# Patient Record
Sex: Male | Born: 1970
Health system: Southern US, Community
[De-identification: ages and names within clinical notes are randomized; demographics above are authoritative.]

## PROBLEM LIST (undated history)

## (undated) DIAGNOSIS — E669 Obesity, unspecified: Secondary | ICD-10-CM

## (undated) DIAGNOSIS — G8929 Other chronic pain: Secondary | ICD-10-CM

## (undated) DIAGNOSIS — G709 Myoneural disorder, unspecified: Secondary | ICD-10-CM

## (undated) DIAGNOSIS — E119 Type 2 diabetes mellitus without complications: Secondary | ICD-10-CM

## (undated) DIAGNOSIS — K219 Gastro-esophageal reflux disease without esophagitis: Secondary | ICD-10-CM

## (undated) DIAGNOSIS — K449 Diaphragmatic hernia without obstruction or gangrene: Secondary | ICD-10-CM

## (undated) DIAGNOSIS — M549 Dorsalgia, unspecified: Secondary | ICD-10-CM

## (undated) DIAGNOSIS — M1990 Unspecified osteoarthritis, unspecified site: Secondary | ICD-10-CM

## (undated) HISTORY — DX: Dorsalgia, unspecified: M54.9

## (undated) HISTORY — DX: Diaphragmatic hernia without obstruction or gangrene: K44.9

## (undated) HISTORY — PX: UPPER GASTROINTESTINAL ENDOSCOPY: SHX188

## (undated) HISTORY — DX: Obesity, unspecified: E66.9

## (undated) HISTORY — DX: Gastro-esophageal reflux disease without esophagitis: K21.9

## (undated) HISTORY — DX: Unspecified osteoarthritis, unspecified site: M19.90

## (undated) HISTORY — DX: Other chronic pain: G89.29

## (undated) HISTORY — DX: Type 2 diabetes mellitus without complications: E11.9

## (undated) HISTORY — DX: Myoneural disorder, unspecified: G70.9

---
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
## 2002-07-27 ENCOUNTER — Ambulatory Visit (HOSPITAL_COMMUNITY): Admission: RE | Admit: 2002-07-27 | Discharge: 2002-07-27 | Payer: Self-pay | Admitting: Gastroenterology

## 2010-08-13 HISTORY — PX: SHOULDER ARTHROSCOPY WITH DEBRIDEMENT AND BICEP TENDON REPAIR: SHX5690

## 2010-11-13 ENCOUNTER — Ambulatory Visit: Payer: BC Managed Care – PPO | Attending: Orthopedic Surgery | Admitting: Physical Therapy

## 2010-11-13 DIAGNOSIS — M25519 Pain in unspecified shoulder: Secondary | ICD-10-CM | POA: Insufficient documentation

## 2010-11-13 DIAGNOSIS — M25619 Stiffness of unspecified shoulder, not elsewhere classified: Secondary | ICD-10-CM | POA: Insufficient documentation

## 2010-11-13 DIAGNOSIS — IMO0001 Reserved for inherently not codable concepts without codable children: Secondary | ICD-10-CM | POA: Insufficient documentation

## 2010-11-15 ENCOUNTER — Ambulatory Visit: Payer: BC Managed Care – PPO | Admitting: Physical Therapy

## 2010-11-20 ENCOUNTER — Ambulatory Visit: Payer: BC Managed Care – PPO | Admitting: Physical Therapy

## 2010-11-22 ENCOUNTER — Ambulatory Visit: Payer: BC Managed Care – PPO | Admitting: Physical Therapy

## 2010-11-27 ENCOUNTER — Ambulatory Visit: Payer: BC Managed Care – PPO | Admitting: Physical Therapy

## 2010-11-29 ENCOUNTER — Ambulatory Visit: Payer: BC Managed Care – PPO | Admitting: Physical Therapy

## 2010-12-05 ENCOUNTER — Ambulatory Visit: Payer: BC Managed Care – PPO | Admitting: Physical Therapy

## 2010-12-19 ENCOUNTER — Ambulatory Visit: Payer: BC Managed Care – PPO | Attending: Orthopedic Surgery | Admitting: Physical Therapy

## 2010-12-19 DIAGNOSIS — M25619 Stiffness of unspecified shoulder, not elsewhere classified: Secondary | ICD-10-CM | POA: Insufficient documentation

## 2010-12-19 DIAGNOSIS — M25519 Pain in unspecified shoulder: Secondary | ICD-10-CM | POA: Insufficient documentation

## 2010-12-19 DIAGNOSIS — IMO0001 Reserved for inherently not codable concepts without codable children: Secondary | ICD-10-CM | POA: Insufficient documentation

## 2011-01-02 ENCOUNTER — Ambulatory Visit: Payer: BC Managed Care – PPO | Admitting: Physical Therapy

## 2012-06-29 ENCOUNTER — Emergency Department
Admission: EM | Admit: 2012-06-29 | Discharge: 2012-06-30 | Disposition: A | Discharge status: Home / Self Care | Payer: Self-pay | Attending: Emergency Medicine | Admitting: Emergency Medicine

## 2012-06-29 ENCOUNTER — Encounter (HOSPITAL_COMMUNITY): Payer: Self-pay

## 2012-06-29 DIAGNOSIS — X58XXXA Exposure to other specified factors, initial encounter: Secondary | ICD-10-CM | POA: Insufficient documentation

## 2012-06-29 DIAGNOSIS — R58 Hemorrhage, not elsewhere classified: Secondary | ICD-10-CM

## 2012-06-29 DIAGNOSIS — S51809A Unspecified open wound of unspecified forearm, initial encounter: Secondary | ICD-10-CM | POA: Insufficient documentation

## 2012-06-29 DIAGNOSIS — Y929 Unspecified place or not applicable: Secondary | ICD-10-CM | POA: Insufficient documentation

## 2012-06-29 MED ORDER — LIDOCAINE-EPINEPHRINE 1 %-1:100000 IJ SOLN
10.00 mL | Freq: Once | INTRAMUSCULAR | Status: AC
Start: 2012-06-29 — End: 2012-06-29
  Administered 2012-06-29: 10 mL via INTRADERMAL
  Filled 2012-06-29: qty 20

## 2012-06-29 MED ORDER — BACITRACIN 500 UNIT/GM EX OINT
TOPICAL_OINTMENT | Freq: Once | CUTANEOUS | Status: AC
Start: 2012-06-29 — End: 2012-06-29
  Filled 2012-06-29: qty 0.9

## 2012-06-29 NOTE — ED Notes (Signed)
Xray here to see pt but the pt is not in the room.  Xray will reschedule the pt for another attempt ''shortly''.

## 2012-06-29 NOTE — ED Notes (Addendum)
Nursing note:  Pt arrives with c/o a possible abcess to the left elbow.  Pt reports that he felt some ''itching'' to the region earlier in the day at the site.  Pt then reports that he killed a spider later in the day that he may have come in contact with.  The area is swollen, red, warm, and has some noticeable bruising to the distal edge of the wound.  Pt has a h/o MRSA to the buttocks.  Pt denies any iv drug use.  Pt states that the wound has become obvious approx 6 hours ago and the he had his ''girl try to pop it''. States that he had blood and pus drain from the wound at that time.    ER resident in with the pt at this time.

## 2012-06-29 NOTE — ED Attending Note (Signed)
ED ATTENDING NOTE:    [facility]g Urgent Care Brief Note:     Pt seen and examined with dr Leda Gauze and I agree with assessment & plan    HPI: agree w/res hpi    PMH/FH/SH/Meds/ALL per triage and dr Leda Gauze notes and HPI    Vitals noted WNL, afebrile, normal O2 saturation on room air  Gen: wdwn, NAD, fluid speech  HEENT: ncat, perrla, anicteric conjunctiva, mmm  Neck: no meningismus, no LAD, no midline ttp, JVP flat  Back: no CVAT, no midline ttp   Lungs: ctab, no w/r/r  CV: rrr, no m/r/g   Abdo: soft, ntnd, nabs, no masses   Ext: LUE with ecchymotic appearing 5inch x 3inch diam patch on undersurface of upper arm just proximal to olecranon, no bony ttp, from of elbow and shoulder, no warmth/induration/erythema or central clearing. No dc from site. NVI. no edema, DP 2+ b/l  Neuro: a&o x4, cn 2-12 grossly intact. Fluid speech. Symmetric face. Nonfocal    Diagnostic studies:     Radiology:No acute osseous abnormality.      Impression&Plan: arm wound most c/w ecchymosis though i did not see it before i and d. Per residents and pt it has "evolved" so we did take a photo and will provide close f/u. There is essentially no induration or warmth, no purulence, i do not think it needs oral abx. Will update tetanus and do xr to r/u elbow injury. Anticipate dc    Strict RTED precautions given for any new or worsening sx and pt expressed understanding    Radiology read neg for osseous abnl, will dc with wound follow up . Most c/w bruise though pt denies injury he was moving furniture today. No secondary sings of infx. Wound check 1-2 days.

## 2012-06-29 NOTE — ED Notes (Signed)
Pt back in room at this time, pt reports that he was in the bathroom at the time that xray was in the room to see him.  Pt instructed to remain in the room until xray completed.

## 2012-06-29 NOTE — ED Notes (Signed)
Bedside u/s complete by ER resident, awaiting orders.

## 2012-06-30 NOTE — ED Notes (Signed)
Assisting primary rn  - Pt is aa&ox3, denies any complaints at this time. Dc instructions provided with verbalized understanding to follow-up with his PMD and to return sooner with any problems/concerns. Pt ambulated out of the ED with steady/even gait. Enc to return in 24-48 hours for a wound check

## 2012-06-30 NOTE — ED Procedure Note (Signed)
Procedure Note  Incision and Drainage  Date/Time: 06/30/2012 1:12 AM  Performed by: Will Bonnet  Authorized by: Rosana Fret  Consent: Verbal consent obtained.  Risks and benefits: risks, benefits and alternatives were discussed  Consent given by: patient  Patient understanding: patient states understanding of the procedure being performed  Patient identity confirmed: verbally with patient  Type: abscess  Body area: upper extremity  Location details: left arm  Anesthesia: local infiltration  Local anesthetic: lidocaine 1% with epinephrine  Anesthetic total: 5 ml  Patient sedated: no  Incision type: single straight  Complexity: simple  Drainage: bloody and serous  Drainage amount: scant  Wound treatment: wound left open  Patient tolerance: Patient tolerated the procedure well with no immediate complications.

## 2012-06-30 NOTE — Discharge Instructions (Signed)
Contusion    You have been diagnosed with a contusion.    A contusion is a bruise. A contusion occurs when something strikes or hits the body. This breaks small blood vessels called capillaries. When the capillaries break, blood leaks out. This makes the skin look red, purple, blue, or black. The injured area may hurt for a few days. If you take a blood thinner (like Coumadin or warfarin) the bruising may be worse.    Apply ice to the bruise. Avoid using the injured body part.    Wound Recheck    You have been seen today for a recheck of your wound.    Your wound is not healing as well as we would expect.    Return for another wound check in 2 days.    YOU SHOULD SEEK MEDICAL ATTENTION IMMEDIATELY, EITHER HERE OR AT THE NEAREST EMERGENCY DEPARTMENT, IF ANY OF THE FOLLOWING OCCURS:   Your wound looks red, you see pus, or you have pain, swelling, or fever. These are signs of infection.   You notice red streaks spreading from the site of the wound.   Your wound seems worse in any way.        Apply ice to help with pain and swelling. Put some ice cubes in a re-sealable plastic bag (like Ziploc). Add some water. Seal the bag. Put a thin washcloth between the bag and the skin. Apply the ice bag for at least 20 minutes. Do this at least 4 times per day. It's okay to apply ice longer or more often. NEVER APPLY ICE DIRECTLY TO THE SKIN. Always keep a washcloth between the ice pack and your body.    YOU SHOULD SEEK MEDICAL ATTENTION IMMEDIATELY, EITHER HERE OR AT THE NEAREST EMERGENCY DEPARTMENT, IF ANY OF THE FOLLOWING OCCURS:   Your pain or swelling gets much worse.   You develop new numbness or tingling in or below the affected area.   Your foot or hand looks cold or pale. This could mean there is a problem with circulation (blood supply).

## 2012-06-30 NOTE — ED Provider Notes (Addendum)
Emergency Dept Note    Chief Complaint:   Chief Complaint   Patient presents with   . Insect Bite     pt states may have been bitten by spider while helping friend move, gf tried to express puss from lue       HPI:  Alisha Burgo is a 41 year old  male with hx of MRSA abcess on buttocks who presents with ecchymosis and swelling for six hours.  Patient was cleaning boxes out of the back of his car and said that he killed a brow recluse spider.  He is unsure if it bit him.  Patient said he squeezed wound and some blood and milky discharge came out.  He denies trauma/f/c/n/v/cp/sob    There are no discharge medications for this patient.      Allergies: Penicillins    Past Medical History:   MRSA abcess    Past Surgical History:   abscess drainage    Family History:   No hx of infection    Social History:   Tobacco: 1ppd  EtOH: no longer  Drug abuse (illicit, IV, Rx): hx of meth but not current user  Living situation: lives in car  Employment: unemployued    ROS:  As per HPI, unless noted below   Review of systems:   Gen (-) fever   Neuro (-) headache, (-) arm/leg weakness, (-) difficulty speaking/walking   ENT (-) sore throat   Eyes (-) blurry vision   Resp (-) cough   GI (-) abd pain, (-) vomiting   CV (-) chest pain   GU (-) dysuria   Musculoskeletal (-) neck pain, (-) back pain   Skin (-) rashes        Physical exam  Vital signs reviewed and noted -   4    06/29/12  1958   BP: 172/91   Pulse: 100   Temp: 98.3 F (36.8 C)   Resp: 16   SpO2: 96%        Gen: Patient is in NAD, A&O, behaving appropriately, non-toxic appearing  HEENT: NC/AT, PERRL. No icterus, ptosis. Normal oropharynx w/out exudates, erythema. Moist mucous membranes.  Neck: Supple, no JVD, no LAD.  Lungs: Normal breath sounds. No wheeze/rales/rhonchi   CV: RRR. Normal heart sounds. No murmurs appreciated  Abdomen: Normal bowel sounds. NTND. No masses, organomegaly.  Back: No CVA tenderness.  Ext: LUE with ecchymotic appearing 5inch x 3inch diam  patch on undersurface of upper arm just proximal to olecranon, no bony ttp, from of elbow and shoulder, no warmth/ slight erythema a small central clearing. No dc from site. NVI. no edema, DP 2+ b/l, 1 cm inscision made over  Neurologic: Mentation appropriate. Gait normal. CN II-XII grossly normal.    LABS  None    DIAGNOSTIC STUDIES  Bedside US showed 0.5cmx0.5cm pocket.  LEX Xray: no osseus abnormality      Assessment and Clinical Decision-making  Arm wound most c/w ecchymosis though there was concern for infection and US showed small fluid collection.  I and D was performed and only serosanguinous fluid came out.  The wound evolved over the course of the stay in the ED and appeared more ecchymotic at the end.  The patient was therefor felt to not need abx, given ecchymotic appearance and no systemic complaints.  Wound was dressed and abx ointment placed over wound.    Dispo: Wound check 1-2 days.      Patient discussed with attending, Dr. Rachell Cipro,  before final recommendations are made.               Will Bonnet, MD  Resident  06/30/12 2245    Will Bonnet, MD  Resident  06/30/12 [phone]

## 2012-07-01 NOTE — ED Follow-up Note (Signed)
Follow-up type: Callback       Routine ED Patient Call Back    Patient unable to be contacted, no message left

## 2012-07-02 NOTE — ED Follow-up Note (Signed)
Follow-up type: Callback       Routine ED Patient Call Back    Patient unable to be contacted, message left

## 2014-10-18 ENCOUNTER — Institutional Professional Consult (permissible substitution): Payer: Self-pay | Admitting: Neurology

## 2014-10-20 ENCOUNTER — Ambulatory Visit (INDEPENDENT_AMBULATORY_CARE_PROVIDER_SITE_OTHER): Payer: BLUE CROSS/BLUE SHIELD | Admitting: Neurology

## 2014-10-20 ENCOUNTER — Encounter: Payer: Self-pay | Admitting: Neurology

## 2014-10-20 VITALS — BP 125/80 | HR 85 | Temp 98.6°F | Resp 20 | Ht 70.0 in | Wt 207.0 lb

## 2014-10-20 DIAGNOSIS — F17200 Nicotine dependence, unspecified, uncomplicated: Secondary | ICD-10-CM

## 2014-10-20 DIAGNOSIS — K219 Gastro-esophageal reflux disease without esophagitis: Secondary | ICD-10-CM | POA: Diagnosis not present

## 2014-10-20 DIAGNOSIS — G2581 Restless legs syndrome: Secondary | ICD-10-CM

## 2014-10-20 DIAGNOSIS — R51 Headache: Secondary | ICD-10-CM | POA: Diagnosis not present

## 2014-10-20 DIAGNOSIS — Z720 Tobacco use: Secondary | ICD-10-CM | POA: Diagnosis not present

## 2014-10-20 DIAGNOSIS — G4733 Obstructive sleep apnea (adult) (pediatric): Secondary | ICD-10-CM

## 2014-10-20 DIAGNOSIS — R519 Headache, unspecified: Secondary | ICD-10-CM

## 2014-10-20 NOTE — Patient Instructions (Signed)

## 2014-10-20 NOTE — Progress Notes (Signed)
Subjective:    Patient ID: Austin Gonzalez is a 44 y.o. male.  HPI     Star Age, MD, PhD [facility] [address] [place], [place] [address]  Dear Dr. Osborne Casco,  I saw your patient, Austin Gonzalez, upon your kind request in my neurologic clinic today for initial consultation of his sleep disorder, in particular, concern for underlying obstructive sleep apnea. The patient is unaccompanied today. As you know, Austin Gonzalez is a very pleasant 44 year old right-handed gentleman with an underlying medical history of hyperlipidemia, reflux disease, type 2 diabetes, chronic back pain and smoking, who reports snoring, which can be loud per his girlfriend's report and waking up with a panic. He has had gasping sensations while asleep and was told he quits breathing in his sleep. His mother has a history of obstructive sleep apnea. He had a tonsillectomy at age 43.  He smokes 1 pack per day, he drinks alcohol occasionally, he denies taking any illicit drugs, and drinks 3 cups of coffee per day. He works as a Occupational hygienist.  He wakes up with a sense of panic and gasping for air. He still has nocturnal GERD and wakes up coughing. He has an occasional morning headaches.   He lives in an apartment, next door to his parents. His bedtime varies, usually after 11 PM or MN. He does not keep a very good sleep awake schedule. He has to get up usually before 8 AM. He does not wake up rested. He does not typically taken. He has never had a sleep study but would be willing to One done. He feels tired during the day. He has occasional restless leg symptoms and that his legs bother him at night and this depends on how much physical work he has done that day. He is not sure if he twitches or kicks in his sleep. His mother was diagnosed with sleep apnea but could not use the CPAP machine. His Epworth sleepiness score is 6 out of 24, his fatigue score is 33. He denies  any parasomnias.  His Past Medical History Is Significant For: Past Medical History  Diagnosis Date  . GERD (gastroesophageal reflux disease)   . Diabetes mellitus without complication   . Obesity   . Chronic back pain   . Hiatal hernia     His Past Surgical History Is Significant For: Past Surgical History  Procedure Laterality Date  . Tonsillectomy  1975  . Shoulder arthroscopy with debridement and bicep tendon repair  2012    His Family History Is Significant For: Family History  Problem Relation Age of Onset  . Arthritis Mother   . Hypertension Mother   . Sleep apnea Mother   . Heart disease Father   . Hypertension Father   . Urolithiasis Father   . Heart attack Father     His Social History Is Significant For: History   Social History  . Marital Status: Single    Spouse Name: N/A  . Number of Children: 0  . Years of Education: Associates   Occupational History  . Self-employed     Architect   Social History Main Topics  . Smoking status: Current Every Day Smoker -- 1.00 packs/day for 25 years    Types: Cigarettes  . Smokeless tobacco: Not on file  . Alcohol Use: 1.2 oz/week    2 Standard drinks or equivalent per week  . Drug Use: Not on file  . Sexual Activity: Not on  file   Other Topics Concern  . None   Social History Narrative   3 caffeine drinks a day     His Allergies Are:  No Known Allergies:   His Current Medications Are:  Outpatient Encounter Prescriptions as of 10/20/2014  Medication Sig  . aspirin 81 MG tablet Take 81 mg by mouth daily.  Marland Kitchen atorvastatin (LIPITOR) 20 MG tablet Take 20 mg by mouth daily.  Marland Kitchen Co-Enzyme Q-10 100 MG CAPS Take by mouth 1 day or 1 dose.  . linagliptin (TRADJENTA) 5 MG TABS tablet Take 5 mg by mouth daily.  . metFORMIN (GLUCOPHAGE) 850 MG tablet Take 850 mg by mouth 2 (two) times daily with a meal.  . Multiple Vitamin (MULTIVITAMIN) capsule Take 1 capsule by mouth daily.  Marland Kitchen omeprazole (PRILOSEC) 20 MG  capsule Take 20 mg by mouth daily.  :  Review of Systems:  Out of a complete 14 point review of systems, all are reviewed and negative with the exception of these symptoms as listed below:   Review of Systems  Eyes:       Blurred vision   Respiratory: Positive for cough.        Snoring  Musculoskeletal:       Joint pain   Psychiatric/Behavioral:       Anxiety     Objective:  Neurologic Exam  Physical Exam Physical Examination:   Filed Vitals:   10/20/14 0844  BP: 125/80  Pulse: 85  Temp: 98.6 F (37 C)  Resp: 20   General Examination: The patient is a very pleasant 44 y.o. male in no acute distress. He appears well-developed and well-nourished and well groomed.   HEENT: Normocephalic, atraumatic, pupils are equal, round and reactive to light and accommodation. Funduscopic exam is normal with sharp disc margins noted. Extraocular tracking is good without limitation to gaze excursion or nystagmus noted. Normal smooth pursuit is noted. Hearing is grossly intact. Tympanic membranes are clear bilaterally. Face is symmetric with normal facial animation and normal facial sensation. Speech is clear with no dysarthria noted. There is no hypophonia. There is no lip, neck/head, jaw or voice tremor. Neck is supple with full range of passive and active motion. There are no carotid bruits on auscultation. Oropharynx exam reveals: mild mouth dryness, good dental hygiene and moderate airway crowding, due to redundant soft palate and large uvula. Tonsils are absent. Mallampati is class II. Neck size is 16-5/8 inches. Tongue protrudes centrally and palate elevates symmetrically. he has a minimal overbite. Nasal inspection reveals no significant nasal mucosal bogginess or redness but he has significant septal deviation to the right.   Chest: Clear to auscultation without wheezing, rhonchi or crackles noted.  Heart: S1+S2+0, regular and normal without murmurs, rubs or gallops noted.   Abdomen:  Soft, non-tender and non-distended with normal bowel sounds appreciated on auscultation.  Extremities: There is no pitting edema in the distal lower extremities bilaterally. Pedal pulses are intact.  Skin: Warm and dry without trophic changes noted. There are no varicose veins.  Musculoskeletal: exam reveals no obvious joint deformities, tenderness or joint swelling or erythema.   Neurologically:  Mental status: The patient is awake, alert and oriented in all 4 spheres. His immediate and remote memory, attention, language skills and fund of knowledge are appropriate. There is no evidence of aphasia, agnosia, apraxia or anomia. Speech is clear with normal prosody and enunciation. Thought process is linear. Mood is normal and affect is normal.  Cranial nerves II - XII  are as described above under HEENT exam. In addition: shoulder shrug is normal with equal shoulder height noted. Motor exam: Normal bulk, strength and tone is noted. There is no drift, tremor or rebound. Romberg is negative. Reflexes are 2+ throughout. Babinski: Toes are flexor bilaterally. Fine motor skills and coordination: intact with normal finger taps, normal hand movements, normal rapid alternating patting, normal foot taps and normal foot agility.  Cerebellar testing: No dysmetria or intention tremor on finger to nose testing. Heel to shin is unremarkable bilaterally. There is no truncal or gait ataxia.  Sensory exam: intact to light touch, pinprick, vibration, temperature sense in the upper and lower extremities.  Gait, station and balance: He stands easily. No veering to one side is noted. No leaning to one side is noted. Posture is age-appropriate and stance is narrow based. Gait shows normal stride length and normal pace. No problems turning are noted. He turns en bloc. Tandem walk is unremarkable.               Assessment and Plan:  In summary, Austin Gonzalez is a very pleasant 44 y.o.-year old male with an underlying  medical history of hyperlipidemia, reflux disease, type 2 diabetes, chronic back pain and smoking, who reports snoring, which can be loud per his girlfriend's report and waking up with a panic. He has had gasping sensations while asleep and was told he quits breathing in his sleep. His mother has a history of obstructive sleep apnea. His history and physical exam are in keeping with obstructive sleep apnea (OSA). I had a long chat with the patient about my findings and the diagnosis of OSA, its prognosis and treatment options. We talked about medical treatments, surgical interventions and non-pharmacological approaches. I explained in particular the risks and ramifications of untreated moderate to severe OSA, especially with respect to developing cardiovascular disease down the Road, including congestive heart failure, difficult to treat hypertension, cardiac arrhythmias, or stroke. Even type 2 diabetes has, in part, been linked to untreated OSA. Symptoms of untreated OSA include daytime sleepiness, memory problems, mood irritability and mood disorder such as depression and anxiety, lack of energy, as well as recurrent headaches, especially morning headaches. We talked about smoking cessation and trying to maintain a healthy lifestyle in general, as well as the importance of weight control. I encouraged the patient to eat healthy, exercise daily and keep well hydrated, to keep a scheduled bedtime and wake time routine, to not skip any meals and eat healthy snacks in between meals. I advised the patient not to drive when feeling sleepy. I recommended the following at this time: sleep study with potential positive airway pressure titration. (We will score hypopneas at 3% and split the sleep study into diagnostic and treatment portion, if the estimated. 2 hour AHI is >15/h).   I explained the sleep test procedure to the patient and also outlined possible surgical and non-surgical treatment options of OSA, including  the use of a custom-made dental device (which would require a referral to a specialist dentist or oral surgeon), upper airway surgical options, such as pillar implants, radiofrequency surgery, tongue base surgery, and UPPP (which would involve a referral to an ENT surgeon). Rarely, jaw surgery such as mandibular advancement may be considered.  I also explained the CPAP treatment option to the patient, who indicated that he would be willing to try CPAP if the need arises. I explained the importance of being compliant with PAP treatment, not only for insurance purposes but  primarily to improve His symptoms, and for the patient's long term health benefit, including to reduce His cardiovascular risks. I answered all his questions today and the patient was in agreement. I would like to see him back after the sleep study is completed and encouraged him to call with any interim questions, concerns, problems or updates.   Thank you very much for allowing me to participate in the care of this nice patient. If I can be of any further assistance to you please do not hesitate to call me at [phone].  Sincerely,   Star Age, MD, PhD

## 2014-10-25 ENCOUNTER — Telehealth: Payer: Self-pay | Admitting: Neurology

## 2014-10-25 NOTE — Telephone Encounter (Signed)
Called and confirmed sleep study appointment on 10/26/14.

## 2014-10-26 ENCOUNTER — Ambulatory Visit (INDEPENDENT_AMBULATORY_CARE_PROVIDER_SITE_OTHER): Payer: BLUE CROSS/BLUE SHIELD | Admitting: Neurology

## 2014-10-26 DIAGNOSIS — G4733 Obstructive sleep apnea (adult) (pediatric): Secondary | ICD-10-CM | POA: Diagnosis not present

## 2014-10-26 DIAGNOSIS — G479 Sleep disorder, unspecified: Secondary | ICD-10-CM

## 2014-10-26 DIAGNOSIS — G4761 Periodic limb movement disorder: Secondary | ICD-10-CM

## 2014-10-27 NOTE — Sleep Study (Signed)
Please see the scanned sleep study interpretation located in the Procedure tab within the Chart Review section. 

## 2014-11-04 ENCOUNTER — Telehealth: Payer: Self-pay | Admitting: Neurology

## 2014-11-04 DIAGNOSIS — G4733 Obstructive sleep apnea (adult) (pediatric): Secondary | ICD-10-CM

## 2014-11-04 NOTE — Telephone Encounter (Signed)
Please call and notify the patient that he has moderate to severe obstructive sleep apnea, and treatment is recommended, but an attempt at CPAP titration was not successful during his sleep study, due to poor sleep consolidation and minimal sleep achieved during the treatment portion of the study. I would like to bring him back for a full night CPAP titration. This will require a repeat sleep study for proper titration and mask fitting. Please explain to patient and arrange for a CPAP titration study. I have placed an order in the chart. Thanks, Star Age, MD, PhD [facility])

## 2014-11-06 ENCOUNTER — Encounter: Payer: Self-pay | Admitting: Neurology

## 2014-11-09 NOTE — Telephone Encounter (Signed)
Multiple attempts to contact patient were made.  Patient was left a detailed message that there was a dx of OSA and treatment in the form of CPAP therapy was recommended by Dr. Rexene Alberts.  Patient was instructed to contact our office if he wanted [place] to process the referral to a local DME.  Dr. Velna Hatchet was faxed a copy of the test results.

## 2016-01-06 DIAGNOSIS — E119 Type 2 diabetes mellitus without complications: Secondary | ICD-10-CM | POA: Diagnosis not present

## 2016-04-12 DIAGNOSIS — Z23 Encounter for immunization: Secondary | ICD-10-CM | POA: Diagnosis not present

## 2016-04-12 DIAGNOSIS — E1139 Type 2 diabetes mellitus with other diabetic ophthalmic complication: Secondary | ICD-10-CM | POA: Diagnosis not present

## 2016-04-12 DIAGNOSIS — R209 Unspecified disturbances of skin sensation: Secondary | ICD-10-CM | POA: Diagnosis not present

## 2016-10-05 DIAGNOSIS — Z0000 Encounter for general adult medical examination without abnormal findings: Secondary | ICD-10-CM | POA: Diagnosis not present

## 2016-10-05 DIAGNOSIS — E1139 Type 2 diabetes mellitus with other diabetic ophthalmic complication: Secondary | ICD-10-CM | POA: Diagnosis not present

## 2016-10-05 DIAGNOSIS — Z125 Encounter for screening for malignant neoplasm of prostate: Secondary | ICD-10-CM | POA: Diagnosis not present

## 2016-10-12 DIAGNOSIS — G4761 Periodic limb movement disorder: Secondary | ICD-10-CM | POA: Diagnosis not present

## 2016-10-12 DIAGNOSIS — K219 Gastro-esophageal reflux disease without esophagitis: Secondary | ICD-10-CM | POA: Diagnosis not present

## 2016-10-12 DIAGNOSIS — F5221 Male erectile disorder: Secondary | ICD-10-CM | POA: Diagnosis not present

## 2016-10-12 DIAGNOSIS — Z1389 Encounter for screening for other disorder: Secondary | ICD-10-CM | POA: Diagnosis not present

## 2016-10-12 DIAGNOSIS — R209 Unspecified disturbances of skin sensation: Secondary | ICD-10-CM | POA: Diagnosis not present

## 2016-10-12 DIAGNOSIS — G4733 Obstructive sleep apnea (adult) (pediatric): Secondary | ICD-10-CM | POA: Diagnosis not present

## 2016-10-12 DIAGNOSIS — Z0000 Encounter for general adult medical examination without abnormal findings: Secondary | ICD-10-CM | POA: Diagnosis not present

## 2017-01-08 DIAGNOSIS — H524 Presbyopia: Secondary | ICD-10-CM | POA: Diagnosis not present

## 2017-01-08 DIAGNOSIS — E113293 Type 2 diabetes mellitus with mild nonproliferative diabetic retinopathy without macular edema, bilateral: Secondary | ICD-10-CM | POA: Diagnosis not present

## 2017-03-01 DIAGNOSIS — F5221 Male erectile disorder: Secondary | ICD-10-CM | POA: Diagnosis not present

## 2017-03-01 DIAGNOSIS — Z6828 Body mass index (BMI) 28.0-28.9, adult: Secondary | ICD-10-CM | POA: Diagnosis not present

## 2017-03-01 DIAGNOSIS — R5381 Other malaise: Secondary | ICD-10-CM | POA: Diagnosis not present

## 2017-03-19 DIAGNOSIS — E113293 Type 2 diabetes mellitus with mild nonproliferative diabetic retinopathy without macular edema, bilateral: Secondary | ICD-10-CM | POA: Diagnosis not present

## 2017-03-19 DIAGNOSIS — E298 Other testicular dysfunction: Secondary | ICD-10-CM | POA: Diagnosis not present

## 2017-03-19 DIAGNOSIS — H524 Presbyopia: Secondary | ICD-10-CM | POA: Diagnosis not present

## 2017-04-26 DIAGNOSIS — E291 Testicular hypofunction: Secondary | ICD-10-CM | POA: Diagnosis not present

## 2017-04-26 DIAGNOSIS — Z23 Encounter for immunization: Secondary | ICD-10-CM | POA: Diagnosis not present

## 2017-04-26 DIAGNOSIS — G4761 Periodic limb movement disorder: Secondary | ICD-10-CM | POA: Diagnosis not present

## 2017-04-26 DIAGNOSIS — G4733 Obstructive sleep apnea (adult) (pediatric): Secondary | ICD-10-CM | POA: Diagnosis not present

## 2017-04-26 DIAGNOSIS — E1139 Type 2 diabetes mellitus with other diabetic ophthalmic complication: Secondary | ICD-10-CM | POA: Diagnosis not present

## 2017-10-14 DIAGNOSIS — Z125 Encounter for screening for malignant neoplasm of prostate: Secondary | ICD-10-CM | POA: Diagnosis not present

## 2017-10-14 DIAGNOSIS — E1139 Type 2 diabetes mellitus with other diabetic ophthalmic complication: Secondary | ICD-10-CM | POA: Diagnosis not present

## 2017-10-14 DIAGNOSIS — R82998 Other abnormal findings in urine: Secondary | ICD-10-CM | POA: Diagnosis not present

## 2017-10-14 DIAGNOSIS — Z0000 Encounter for general adult medical examination without abnormal findings: Secondary | ICD-10-CM | POA: Diagnosis not present

## 2017-10-21 DIAGNOSIS — E1139 Type 2 diabetes mellitus with other diabetic ophthalmic complication: Secondary | ICD-10-CM | POA: Diagnosis not present

## 2017-10-21 DIAGNOSIS — Z6828 Body mass index (BMI) 28.0-28.9, adult: Secondary | ICD-10-CM | POA: Diagnosis not present

## 2017-10-21 DIAGNOSIS — Z0000 Encounter for general adult medical examination without abnormal findings: Secondary | ICD-10-CM | POA: Diagnosis not present

## 2017-10-21 DIAGNOSIS — F518 Other sleep disorders not due to a substance or known physiological condition: Secondary | ICD-10-CM | POA: Diagnosis not present

## 2017-10-21 DIAGNOSIS — M5489 Other dorsalgia: Secondary | ICD-10-CM | POA: Diagnosis not present

## 2017-10-21 DIAGNOSIS — Z1389 Encounter for screening for other disorder: Secondary | ICD-10-CM | POA: Diagnosis not present

## 2017-10-21 DIAGNOSIS — E298 Other testicular dysfunction: Secondary | ICD-10-CM | POA: Diagnosis not present

## 2017-10-22 DIAGNOSIS — Z1212 Encounter for screening for malignant neoplasm of rectum: Secondary | ICD-10-CM | POA: Diagnosis not present

## 2018-01-10 DIAGNOSIS — E119 Type 2 diabetes mellitus without complications: Secondary | ICD-10-CM | POA: Diagnosis not present

## 2018-01-29 DIAGNOSIS — D225 Melanocytic nevi of trunk: Secondary | ICD-10-CM | POA: Diagnosis not present

## 2018-01-29 DIAGNOSIS — D2262 Melanocytic nevi of left upper limb, including shoulder: Secondary | ICD-10-CM | POA: Diagnosis not present

## 2018-01-29 DIAGNOSIS — L905 Scar conditions and fibrosis of skin: Secondary | ICD-10-CM | POA: Diagnosis not present

## 2018-01-29 DIAGNOSIS — D485 Neoplasm of uncertain behavior of skin: Secondary | ICD-10-CM | POA: Diagnosis not present

## 2018-01-29 DIAGNOSIS — D1801 Hemangioma of skin and subcutaneous tissue: Secondary | ICD-10-CM | POA: Diagnosis not present

## 2018-01-29 DIAGNOSIS — L820 Inflamed seborrheic keratosis: Secondary | ICD-10-CM | POA: Diagnosis not present

## 2018-01-29 DIAGNOSIS — D2261 Melanocytic nevi of right upper limb, including shoulder: Secondary | ICD-10-CM | POA: Diagnosis not present

## 2018-02-15 ENCOUNTER — Encounter (HOSPITAL_COMMUNITY): Payer: Self-pay

## 2018-02-15 ENCOUNTER — Emergency Department (HOSPITAL_COMMUNITY): Payer: BLUE CROSS/BLUE SHIELD

## 2018-02-15 ENCOUNTER — Emergency Department (HOSPITAL_COMMUNITY)
Admission: EM | Admit: 2018-02-15 | Discharge: 2018-02-15 | Disposition: A | Discharge status: Home / Self Care | Payer: BLUE CROSS/BLUE SHIELD | Attending: Emergency Medicine | Admitting: Emergency Medicine

## 2018-02-15 DIAGNOSIS — M549 Dorsalgia, unspecified: Secondary | ICD-10-CM | POA: Diagnosis not present

## 2018-02-15 DIAGNOSIS — E119 Type 2 diabetes mellitus without complications: Secondary | ICD-10-CM | POA: Insufficient documentation

## 2018-02-15 DIAGNOSIS — Z79899 Other long term (current) drug therapy: Secondary | ICD-10-CM | POA: Diagnosis not present

## 2018-02-15 DIAGNOSIS — Z7982 Long term (current) use of aspirin: Secondary | ICD-10-CM | POA: Diagnosis not present

## 2018-02-15 DIAGNOSIS — Z7984 Long term (current) use of oral hypoglycemic drugs: Secondary | ICD-10-CM | POA: Diagnosis not present

## 2018-02-15 DIAGNOSIS — R0789 Other chest pain: Secondary | ICD-10-CM | POA: Diagnosis not present

## 2018-02-15 DIAGNOSIS — F17210 Nicotine dependence, cigarettes, uncomplicated: Secondary | ICD-10-CM | POA: Insufficient documentation

## 2018-02-15 DIAGNOSIS — R062 Wheezing: Secondary | ICD-10-CM | POA: Diagnosis not present

## 2018-02-15 DIAGNOSIS — Z6829 Body mass index (BMI) 29.0-29.9, adult: Secondary | ICD-10-CM | POA: Diagnosis not present

## 2018-02-15 DIAGNOSIS — M5489 Other dorsalgia: Secondary | ICD-10-CM | POA: Insufficient documentation

## 2018-02-15 DIAGNOSIS — R079 Chest pain, unspecified: Secondary | ICD-10-CM | POA: Diagnosis not present

## 2018-02-15 LAB — CBC
HCT: 43.6 % (ref 39.0–52.0)
Hemoglobin: 14.5 g/dL (ref 13.0–17.0)
MCH: 30.5 pg (ref 26.0–34.0)
MCHC: 33.3 g/dL (ref 30.0–36.0)
MCV: 91.6 fL (ref 78.0–100.0)
Platelets: 153 10*3/uL (ref 150–400)
RBC: 4.76 MIL/uL (ref 4.22–5.81)
RDW: 11.9 % (ref 11.5–15.5)
WBC: 6.9 10*3/uL (ref 4.0–10.5)

## 2018-02-15 LAB — BASIC METABOLIC PANEL
Anion gap: 6 (ref 5–15)
BUN: 11 mg/dL (ref 6–20)
CHLORIDE: 104 mmol/L (ref 98–111)
CO2: 28 mmol/L (ref 22–32)
CREATININE: 0.83 mg/dL (ref 0.61–1.24)
Calcium: 9.5 mg/dL (ref 8.9–10.3)
GFR calc Af Amer: >60 mL/min (ref >60)
GFR calc non Af Amer: >60 mL/min (ref >60)
GLUCOSE: 150 mg/dL — AB (ref 70–99)
POTASSIUM: 4.3 mmol/L (ref 3.5–5.1)
SODIUM: 138 mmol/L (ref 135–145)

## 2018-02-15 LAB — I-STAT TROPONIN, ED
TROPONIN I, POC: 0 ng/mL (ref 0.00–0.08)
Troponin i, poc: 0 ng/mL (ref 0.00–0.08)

## 2018-02-15 NOTE — ED Triage Notes (Signed)
Pt presents with intermittent chest pain for a while, reports pain is becoming more frequent and more painful.  Pt reports some shortness of breath.  Reports pain radiates into mid-scapula.

## 2018-02-15 NOTE — ED Provider Notes (Signed)
Care assumed from Sharon Mt, PA-C, at about 1530. Per outgoing provider, patient presenting for subacute-to-chronic atypical chest and back pain. Exam and EKG reassuring. Initial troponin negative. ACS or other cardiovascular etiology felt less likely. Plan is to f/u serial troponin, d/c if negative.  Second troponin negative. Labs and EKG reviewed. Patient reassessed the bedside. Resting comfortable. History of present illness briefly reviewed. Reviewed plan from prior provider. He is comfortable discharge with PCP follow-up. Strict return precautions given. All questions answered. Discharged home in stable condition.   Prescilla Sours, MD 02/15/18 1898    Elnora Morrison, MD 02/16/18 [phone]

## 2018-02-15 NOTE — ED Notes (Signed)
Patient reports chest pain that  Has been going for a few weeks(intermitently) but the last few days has been "constant jabbing and tightening". He reports that his dad, and multiple male family members has had "heart attacks" Patient is a Current smoker, diabetic, and has high cholesterol

## 2018-02-15 NOTE — ED Provider Notes (Signed)
[facility]ER[facility]ENC[facility] DEPARTMENT Provider Note   CSN: [id number] Arrival date & time: 02/15/18  1259   History   Chief Complaint Chief Complaint  Patient presents with  . Chest Pain    HPI Austin Gonzalez. is a 47 y.o. male who presents with chest pain. PMH significant for DM, GERD, hiatal hernia, chronic low back pain.  Patient states that he has had intermittent chest tingling in the left side of his chest for several weeks.  Nothing makes it better or worse.  The feeling is random and he cannot associate it with anything.  Sometimes feels sharp sometimes feels like tingling.  Over the past 3 to 4 days he has had a deep soreness and not like sensation over the middle of his back between the shoulder blades.  This pain is constant and feels deep and sometimes radiates to the chest.  He has never had this feeling before.  He reports intermittent chronic low back pain but states that this feels different.  He does have a skin nodule over his mid back but states that that is not where his pain is.  Last night the pain was bothering him so he took aspirin and Aleve which helped him go to sleep.  He went to urgent care today to get this pain checked out however was told to come to the emergency department because of his risk factors.  He is a diabetic and a smoker and has had several family members diagnosed with cardiac issues under the age of 28.  He does do a very physical job and has been under a lot of stress lately recently because he has had to do the job of 2 people.  He works in [employer] and has been lifting heavy heavy items but has not felt any acute pain from doing this.  He denies fever, chills, shortness of breath, cough, diaphoresis, abdominal pain, nausea, vomiting, leg swelling.  He reports mild chest congestion and wheezing which he attributed to smoking. He reports remote hx of stress testing ~10 years ago.   HPI  Past Medical History:  Diagnosis Date  .  Chronic back pain   . Diabetes mellitus without complication ([place])   . GERD (gastroesophageal reflux disease)   . Hiatal hernia   . Obesity     There are no active problems to display for this patient.   Past Surgical History:  Procedure Laterality Date  . SHOULDER ARTHROSCOPY WITH DEBRIDEMENT AND BICEP TENDON REPAIR  2012  . TONSILLECTOMY  1975        Home Medications    Prior to Admission medications   Medication Sig Start Date End Date Taking? Authorizing Provider  aspirin 81 MG tablet Take 81 mg by mouth daily.    [provider]  atorvastatin (LIPITOR) 20 MG tablet Take 20 mg by mouth daily.    [provider]  Co-Enzyme Q-10 100 MG CAPS Take by mouth 1 day or 1 dose.    [provider]  linagliptin (TRADJENTA) 5 MG TABS tablet Take 5 mg by mouth daily.    [provider]  metFORMIN (GLUCOPHAGE) 850 MG tablet Take 850 mg by mouth 2 (two) times daily with a meal.    [provider]  Multiple Vitamin (MULTIVITAMIN) capsule Take 1 capsule by mouth daily.    [provider]  omeprazole (PRILOSEC) 20 MG capsule Take 20 mg by mouth daily.    [provider]  Family History Family History  Problem Relation Age of Onset  . Arthritis Mother   . Hypertension Mother   . Sleep apnea Mother   . Heart disease Father   . Hypertension Father   . Urolithiasis Father   . Heart attack Father     Social History Social History   Tobacco Use  . Smoking status: Current Every Day Smoker    Packs/day: 1.00    Years: 25.00    Pack years: 25.00    Types: Cigarettes  . Smokeless tobacco: Never Used  Substance Use Topics  . Alcohol use: Yes    Alcohol/week: 1.2 oz    Types: 2 Standard drinks or equivalent per week  . Drug use: Not on file     Allergies   Patient has no known allergies.   Review of Systems Review of Systems  Constitutional: Negative for chills and fever.  HENT: Positive for congestion.     Respiratory: Positive for wheezing. Negative for cough, chest tightness and shortness of breath.   Cardiovascular: Positive for chest pain. Negative for palpitations and leg swelling.  Gastrointestinal: Negative for abdominal pain, nausea and vomiting.  Musculoskeletal: Positive for back pain.  Neurological: Negative for syncope and light-headedness.  All other systems reviewed and are negative.    Physical Exam Updated Vital Signs BP 121/87 (BP Location: Right Arm)   Pulse 86   Temp 98.4 F (36.9 C) (Oral)   Resp 20   Ht 5\' 10"  (1.778 m)   Wt 93.9 kg (207 lb)   SpO2 98%   BMI 29.70 kg/m   Physical Exam  Constitutional: He is oriented to person, place, and time. He appears well-developed and well-nourished. No distress.  Calm and cooperative  HENT:  Head: Normocephalic and atraumatic.  Eyes: Pupils are equal, round, and reactive to light. Conjunctivae are normal. Right eye exhibits no discharge. Left eye exhibits no discharge. No scleral icterus.  Neck: Normal range of motion.  Cardiovascular: Normal rate and regular rhythm. Exam reveals no gallop and no friction rub.  No murmur heard. Pulmonary/Chest: Effort normal and breath sounds normal. No stridor. No respiratory distress. He has no wheezes. He has no rales. He exhibits no tenderness.  Abdominal: Soft. Bowel sounds are normal. He exhibits no distension. There is no tenderness.  Musculoskeletal:  No peripheral edema  Back: No mid-back tenderness. Skin mass over the right upper back which is non-tender  Neurological: He is alert and oriented to person, place, and time.  Skin: Skin is warm and dry.  Psychiatric: He has a normal mood and affect. His behavior is normal.  Nursing note and vitals reviewed.    ED Treatments / Results  Labs (all labs ordered are listed, but only abnormal results are displayed) Labs Reviewed  BASIC METABOLIC PANEL - Abnormal; Notable for the following components:      Result Value    Glucose, Bld 150 (*)    All other components within normal limits  CBC  I-STAT TROPONIN, ED    EKG None  Radiology Dg Chest 2 View  Result Date: 02/15/2018 CLINICAL DATA:  Upper back and chest pain for a few weeks. EXAM: CHEST - 2 VIEW COMPARISON:  None. FINDINGS: The lungs are clear without focal pneumonia, edema, pneumothorax or pleural effusion. The cardiopericardial silhouette is within normal limits for size. The visualized bony structures of the thorax are intact. Telemetry leads overlie the chest. IMPRESSION: No active cardiopulmonary disease. Electronically Signed   By: Verda Cumins.D.  On: 02/15/2018 14:33    Procedures Procedures (including critical care time)  Medications Ordered in ED Medications - No data to display   Initial Impression / Assessment and Plan / ED Course  I have reviewed the triage vital signs and the nursing notes.  Pertinent labs & imaging results that were available during my care of the patient were reviewed by me and considered in my medical decision making (see chart for details).  47 year old male with constant mid back pain and intermittent atypical chest pain. Chest pain work up is reassuring. Doubt ACS, PE, pericarditis, esophageal rupture, tension pneumothorax, aortic dissection, cardiac tamponade. EKG is NSR. CXR is negative. Troponin is 0. Labs are remarkable for hyperglycemia. HEART score is 3. PERC negative. Pain is possibly MSK due to his heavy lifting. Shared visit with Dr. Vanita Panda. We will delta troponin him and if negative have him f/u with PCP. Care transferred to Dr. Hazle Nordmann who will dispo.   Final Clinical Impressions(s) / ED Diagnoses   Final diagnoses:  Mid back pain  Atypical chest pain    ED Discharge Orders    None       Recardo Evangelist, PA-C 02/15/18 1538    Carmin Muskrat, MD 02/17/18 2023

## 2018-04-30 DIAGNOSIS — E291 Testicular hypofunction: Secondary | ICD-10-CM | POA: Diagnosis not present

## 2018-04-30 DIAGNOSIS — F17210 Nicotine dependence, cigarettes, uncomplicated: Secondary | ICD-10-CM | POA: Diagnosis not present

## 2018-04-30 DIAGNOSIS — Z23 Encounter for immunization: Secondary | ICD-10-CM | POA: Diagnosis not present

## 2018-04-30 DIAGNOSIS — K219 Gastro-esophageal reflux disease without esophagitis: Secondary | ICD-10-CM | POA: Diagnosis not present

## 2018-04-30 DIAGNOSIS — E1139 Type 2 diabetes mellitus with other diabetic ophthalmic complication: Secondary | ICD-10-CM | POA: Diagnosis not present

## 2018-10-22 DIAGNOSIS — R82998 Other abnormal findings in urine: Secondary | ICD-10-CM | POA: Diagnosis not present

## 2018-10-22 DIAGNOSIS — E1139 Type 2 diabetes mellitus with other diabetic ophthalmic complication: Secondary | ICD-10-CM | POA: Diagnosis not present

## 2018-10-22 DIAGNOSIS — Z0000 Encounter for general adult medical examination without abnormal findings: Secondary | ICD-10-CM | POA: Diagnosis not present

## 2018-10-22 DIAGNOSIS — Z125 Encounter for screening for malignant neoplasm of prostate: Secondary | ICD-10-CM | POA: Diagnosis not present

## 2018-11-05 DIAGNOSIS — E1139 Type 2 diabetes mellitus with other diabetic ophthalmic complication: Secondary | ICD-10-CM | POA: Diagnosis not present

## 2018-11-05 DIAGNOSIS — E663 Overweight: Secondary | ICD-10-CM | POA: Diagnosis not present

## 2018-11-05 DIAGNOSIS — Z0000 Encounter for general adult medical examination without abnormal findings: Secondary | ICD-10-CM | POA: Diagnosis not present

## 2018-11-05 DIAGNOSIS — F17210 Nicotine dependence, cigarettes, uncomplicated: Secondary | ICD-10-CM | POA: Diagnosis not present

## 2018-11-05 DIAGNOSIS — K219 Gastro-esophageal reflux disease without esophagitis: Secondary | ICD-10-CM | POA: Diagnosis not present

## 2019-01-13 DIAGNOSIS — H52203 Unspecified astigmatism, bilateral: Secondary | ICD-10-CM | POA: Diagnosis not present

## 2019-01-13 DIAGNOSIS — H524 Presbyopia: Secondary | ICD-10-CM | POA: Diagnosis not present

## 2019-01-13 DIAGNOSIS — Z961 Presence of intraocular lens: Secondary | ICD-10-CM | POA: Diagnosis not present

## 2019-01-13 DIAGNOSIS — E119 Type 2 diabetes mellitus without complications: Secondary | ICD-10-CM | POA: Diagnosis not present

## 2019-02-11 DIAGNOSIS — E663 Overweight: Secondary | ICD-10-CM | POA: Diagnosis not present

## 2019-02-11 DIAGNOSIS — E1139 Type 2 diabetes mellitus with other diabetic ophthalmic complication: Secondary | ICD-10-CM | POA: Diagnosis not present

## 2019-02-11 DIAGNOSIS — F17210 Nicotine dependence, cigarettes, uncomplicated: Secondary | ICD-10-CM | POA: Diagnosis not present

## 2019-02-11 DIAGNOSIS — E7800 Pure hypercholesterolemia, unspecified: Secondary | ICD-10-CM | POA: Diagnosis not present

## 2019-05-18 DIAGNOSIS — F5221 Male erectile disorder: Secondary | ICD-10-CM | POA: Diagnosis not present

## 2019-05-18 DIAGNOSIS — E1139 Type 2 diabetes mellitus with other diabetic ophthalmic complication: Secondary | ICD-10-CM | POA: Diagnosis not present

## 2019-05-18 DIAGNOSIS — E7800 Pure hypercholesterolemia, unspecified: Secondary | ICD-10-CM | POA: Diagnosis not present

## 2019-05-18 DIAGNOSIS — Z23 Encounter for immunization: Secondary | ICD-10-CM | POA: Diagnosis not present

## 2019-05-18 DIAGNOSIS — G4733 Obstructive sleep apnea (adult) (pediatric): Secondary | ICD-10-CM | POA: Diagnosis not present

## 2019-05-18 DIAGNOSIS — E291 Testicular hypofunction: Secondary | ICD-10-CM | POA: Diagnosis not present

## 2019-08-14 IMAGING — DX DG CHEST 2V
2 series · 2 of 2 positions shown · non-contrast
Comparison: None.

CLINICAL DATA: Upper back and chest pain for a few weeks.

EXAM:
CHEST - 2 VIEW

[w chest pa]
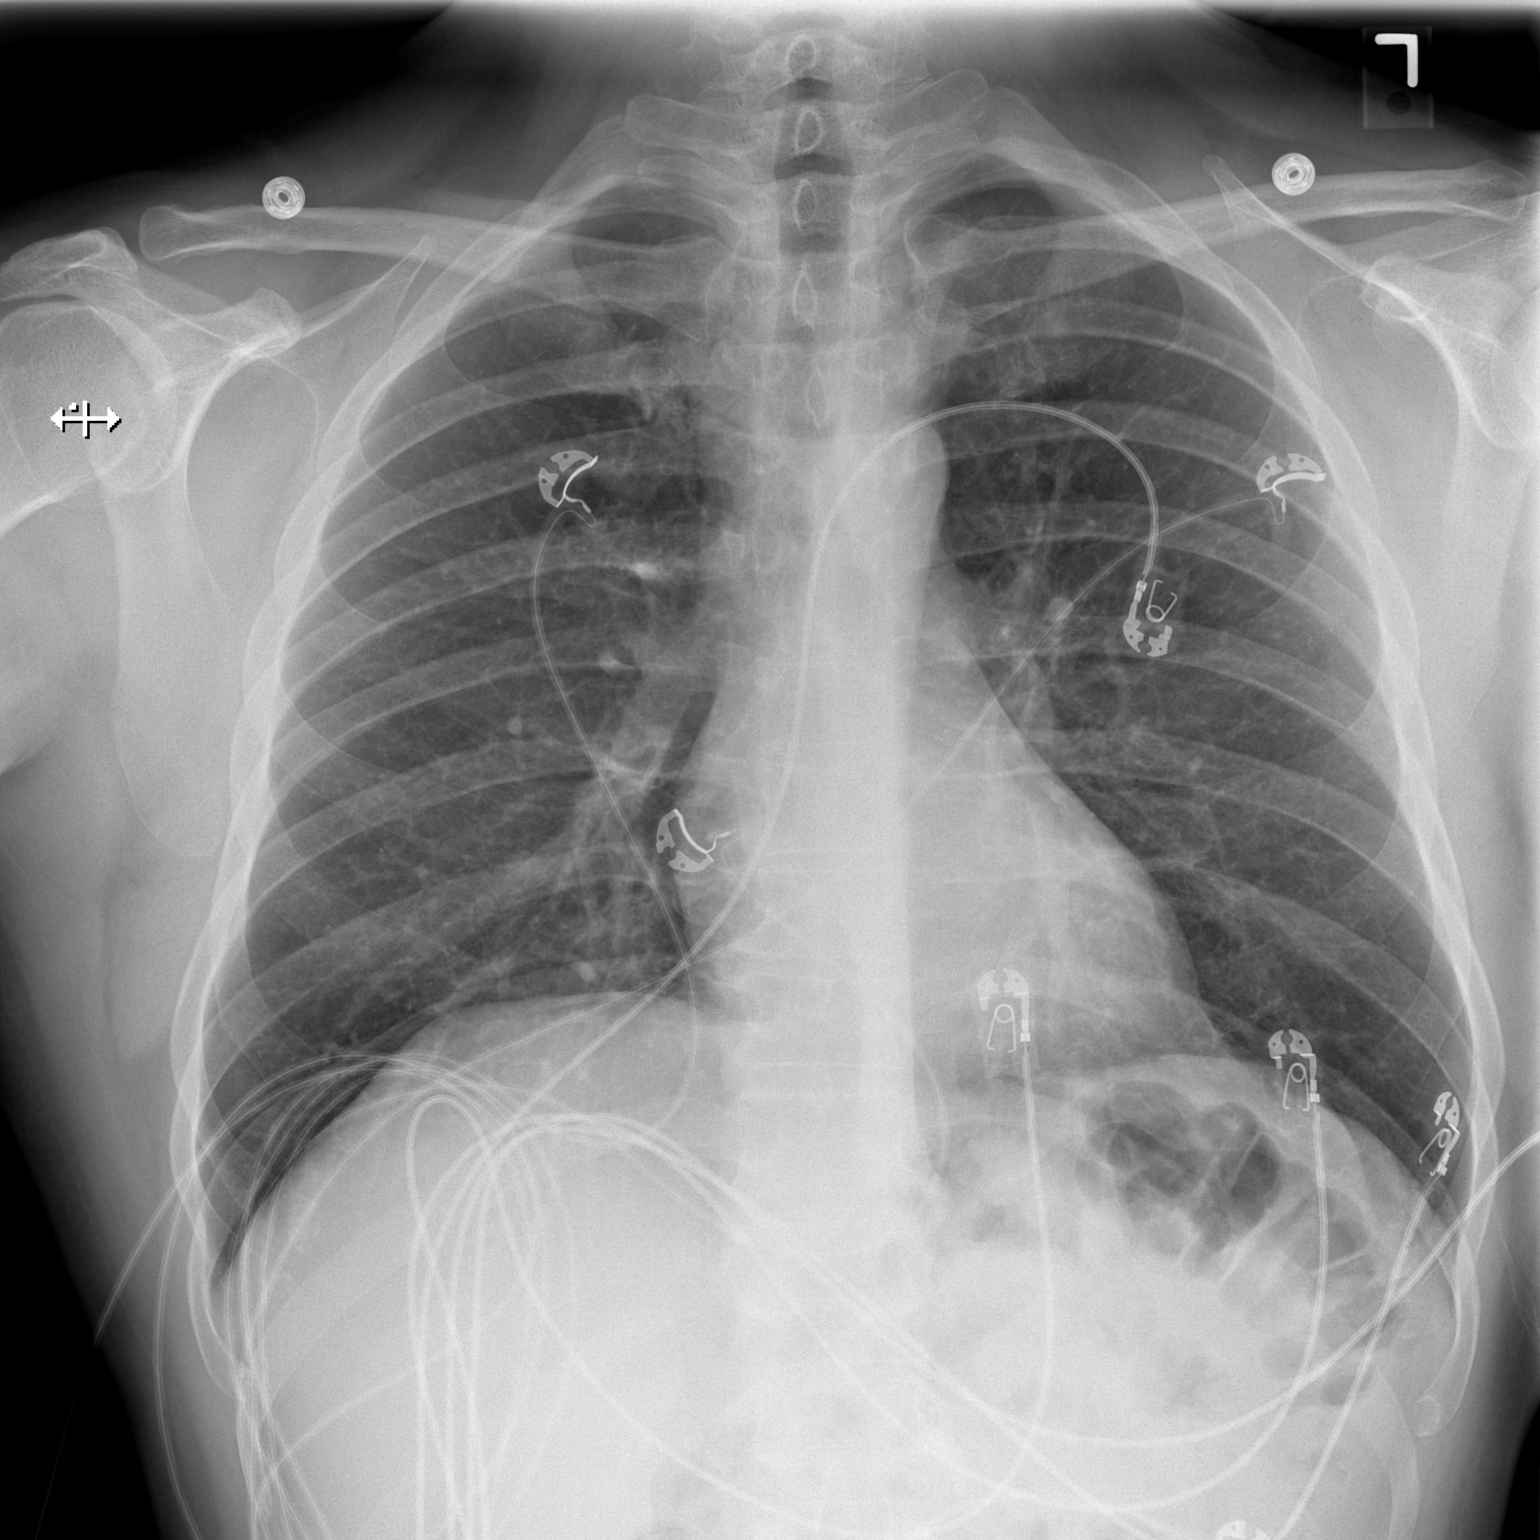

[w chest lat]
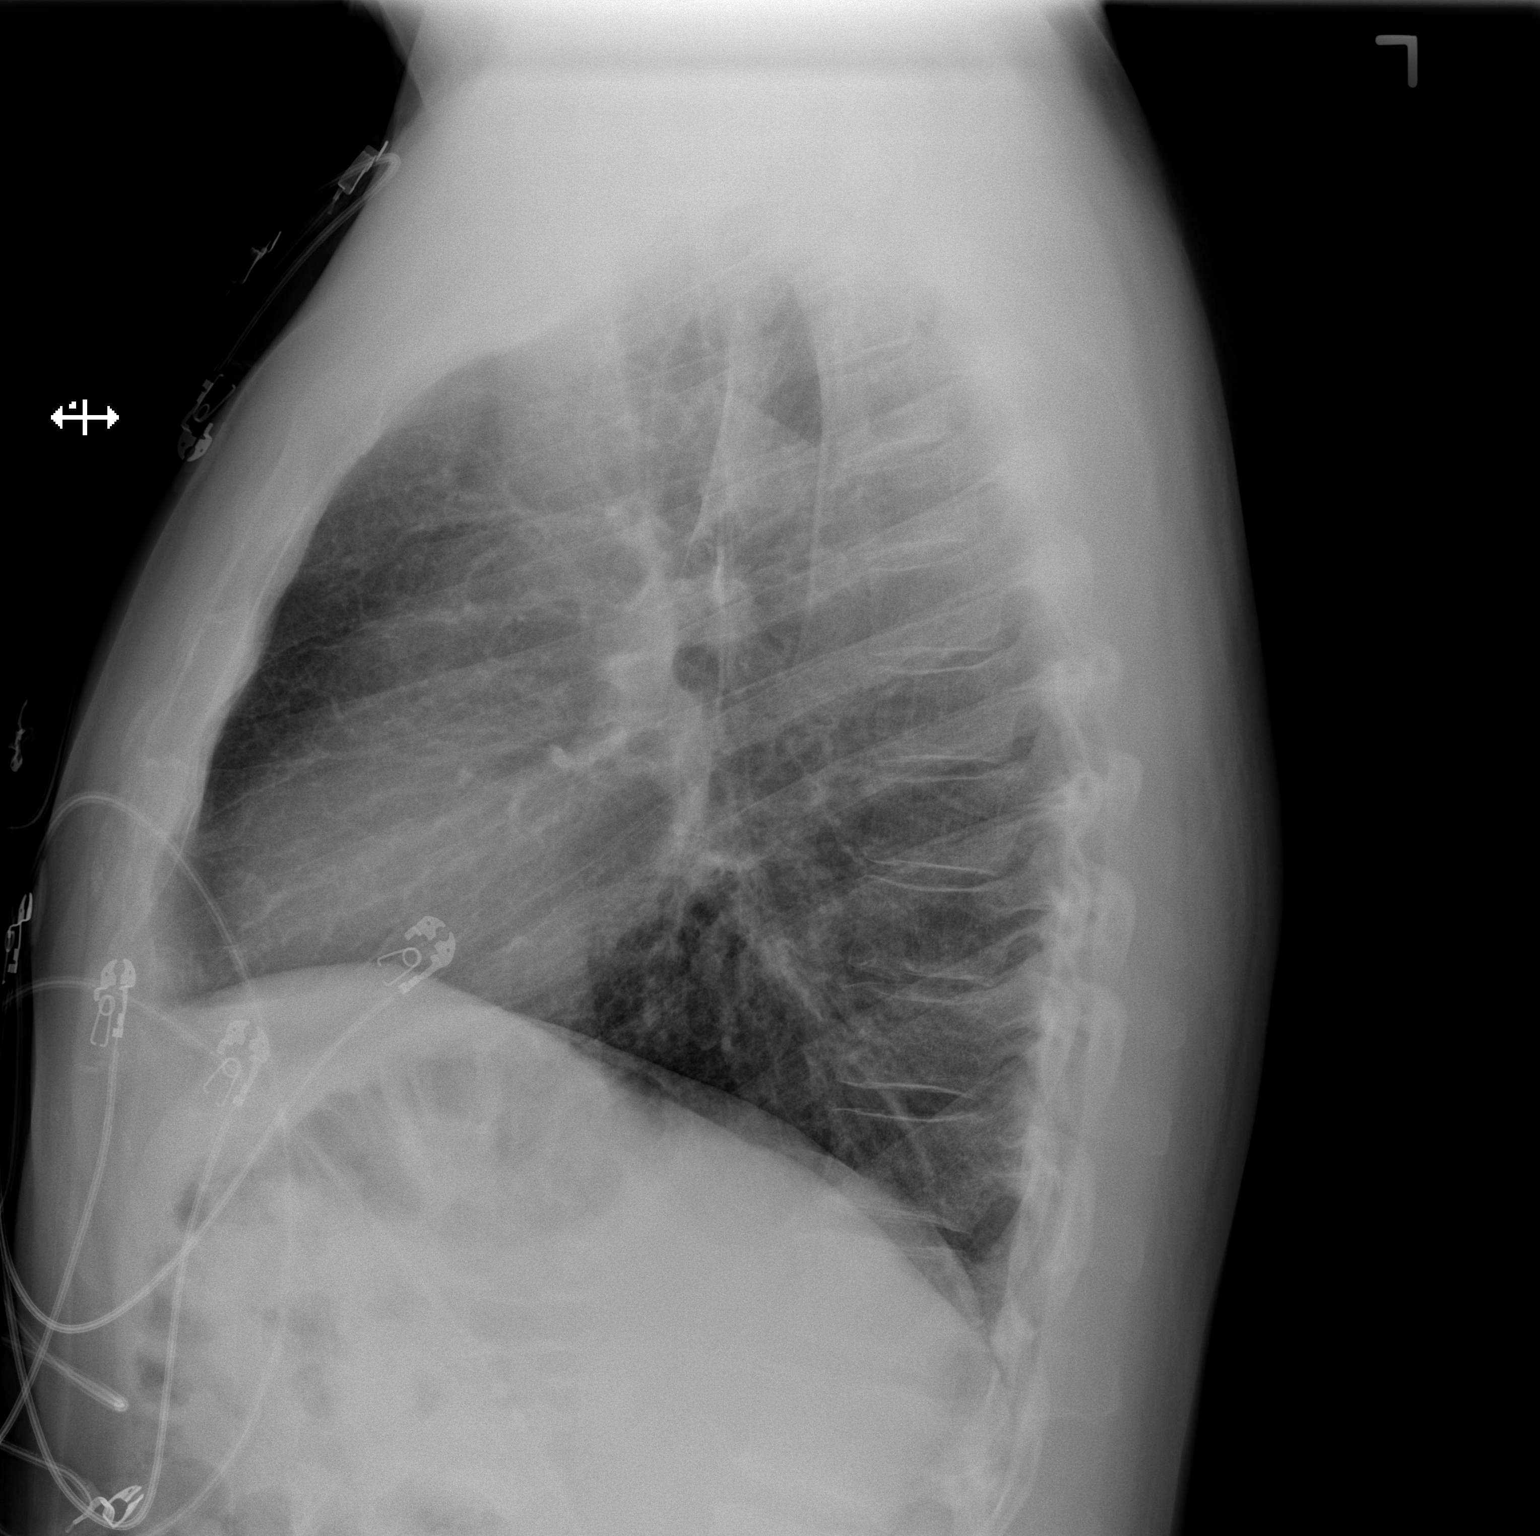

[2 of 2 positions shown; findings below may reference images not displayed]

FINDINGS: The lungs are clear without focal pneumonia, edema, pneumothorax or
pleural effusion. The cardiopericardial silhouette is within normal
limits for size. The visualized bony structures of the thorax are
intact. Telemetry leads overlie the chest.
IMPRESSION: No active cardiopulmonary disease.

## 2019-08-19 DIAGNOSIS — E1139 Type 2 diabetes mellitus with other diabetic ophthalmic complication: Secondary | ICD-10-CM | POA: Diagnosis not present

## 2019-08-19 DIAGNOSIS — G4733 Obstructive sleep apnea (adult) (pediatric): Secondary | ICD-10-CM | POA: Diagnosis not present

## 2019-08-19 DIAGNOSIS — F17200 Nicotine dependence, unspecified, uncomplicated: Secondary | ICD-10-CM | POA: Diagnosis not present

## 2019-08-19 DIAGNOSIS — G4761 Periodic limb movement disorder: Secondary | ICD-10-CM | POA: Diagnosis not present

## 2019-11-04 DIAGNOSIS — Z125 Encounter for screening for malignant neoplasm of prostate: Secondary | ICD-10-CM | POA: Diagnosis not present

## 2019-11-04 DIAGNOSIS — E291 Testicular hypofunction: Secondary | ICD-10-CM | POA: Diagnosis not present

## 2019-11-04 DIAGNOSIS — E7800 Pure hypercholesterolemia, unspecified: Secondary | ICD-10-CM | POA: Diagnosis not present

## 2019-11-04 DIAGNOSIS — E1139 Type 2 diabetes mellitus with other diabetic ophthalmic complication: Secondary | ICD-10-CM | POA: Diagnosis not present

## 2019-11-04 DIAGNOSIS — Z0000 Encounter for general adult medical examination without abnormal findings: Secondary | ICD-10-CM | POA: Diagnosis not present

## 2019-11-11 DIAGNOSIS — G4761 Periodic limb movement disorder: Secondary | ICD-10-CM | POA: Diagnosis not present

## 2019-11-11 DIAGNOSIS — E1139 Type 2 diabetes mellitus with other diabetic ophthalmic complication: Secondary | ICD-10-CM | POA: Diagnosis not present

## 2019-11-11 DIAGNOSIS — F5221 Male erectile disorder: Secondary | ICD-10-CM | POA: Diagnosis not present

## 2019-11-11 DIAGNOSIS — R82998 Other abnormal findings in urine: Secondary | ICD-10-CM | POA: Diagnosis not present

## 2019-11-11 DIAGNOSIS — R5381 Other malaise: Secondary | ICD-10-CM | POA: Diagnosis not present

## 2019-11-11 DIAGNOSIS — Z1212 Encounter for screening for malignant neoplasm of rectum: Secondary | ICD-10-CM | POA: Diagnosis not present

## 2019-11-11 DIAGNOSIS — Z0000 Encounter for general adult medical examination without abnormal findings: Secondary | ICD-10-CM | POA: Diagnosis not present

## 2019-11-11 DIAGNOSIS — G4733 Obstructive sleep apnea (adult) (pediatric): Secondary | ICD-10-CM | POA: Diagnosis not present

## 2019-11-11 DIAGNOSIS — Z1331 Encounter for screening for depression: Secondary | ICD-10-CM | POA: Diagnosis not present

## 2020-01-18 ENCOUNTER — Encounter: Payer: Self-pay | Admitting: Gastroenterology

## 2020-01-18 DIAGNOSIS — H524 Presbyopia: Secondary | ICD-10-CM | POA: Diagnosis not present

## 2020-01-18 DIAGNOSIS — E113293 Type 2 diabetes mellitus with mild nonproliferative diabetic retinopathy without macular edema, bilateral: Secondary | ICD-10-CM | POA: Diagnosis not present

## 2020-03-04 DIAGNOSIS — M549 Dorsalgia, unspecified: Secondary | ICD-10-CM | POA: Insufficient documentation

## 2020-03-04 DIAGNOSIS — K219 Gastro-esophageal reflux disease without esophagitis: Secondary | ICD-10-CM | POA: Insufficient documentation

## 2020-03-04 DIAGNOSIS — E119 Type 2 diabetes mellitus without complications: Secondary | ICD-10-CM | POA: Insufficient documentation

## 2020-03-04 DIAGNOSIS — G8929 Other chronic pain: Secondary | ICD-10-CM | POA: Insufficient documentation

## 2020-03-18 ENCOUNTER — Other Ambulatory Visit: Payer: Self-pay

## 2020-03-18 ENCOUNTER — Ambulatory Visit (AMBULATORY_SURGERY_CENTER): Payer: Self-pay | Admitting: *Deleted

## 2020-03-18 ENCOUNTER — Encounter: Payer: Self-pay | Admitting: Gastroenterology

## 2020-03-18 VITALS — Ht 70.0 in | Wt 195.0 lb

## 2020-03-18 DIAGNOSIS — E119 Type 2 diabetes mellitus without complications: Secondary | ICD-10-CM

## 2020-03-18 DIAGNOSIS — Z01818 Encounter for other preprocedural examination: Secondary | ICD-10-CM

## 2020-03-18 DIAGNOSIS — Z1211 Encounter for screening for malignant neoplasm of colon: Secondary | ICD-10-CM

## 2020-03-18 MED ORDER — PLENVU 140 G PO SOLR: 1.0000 | ORAL | 0 refills | Status: DC

## 2020-03-18 NOTE — Progress Notes (Signed)
No egg or soy allergy known to patient  No issues with past sedation with any surgeries or procedures no intubation problems in the past  No FH of Malignant Hyperthermia No diet pills per patient No home 02 use per patient  No blood thinners per patient  Pt denies issues with constipation  No A fib or A flutter  EMMI video to pt or via MyChart  COVID 19 guidelines implemented in [facility] today with Pt and RN   Plenvu Coupon given to pt in [facility] today , Code to Pharmacy   Due to the COVID-19 pandemic we are asking patients to follow these guidelines. Please only bring one care partner. Please be aware that your care partner may wait in the car in the parking lot or if they feel like they will be too hot to wait in the car, they may wait in the lobby on the 4th floor. All care partners are required to wear a mask the entire time (we do not have any that we can provide them), they need to practice social distancing, and we will do a Covid check for all patient's and care partners when you arrive. Also we will check their temperature and your temperature. If the care partner waits in their car they need to stay in the parking lot the entire time and we will call them on their cell phone when the patient is ready for discharge so they can bring the car to the front of the building. Also all patient's will need to wear a mask into building.  

## 2020-03-30 ENCOUNTER — Other Ambulatory Visit: Payer: Self-pay | Admitting: Gastroenterology

## 2020-03-30 ENCOUNTER — Ambulatory Visit (INDEPENDENT_AMBULATORY_CARE_PROVIDER_SITE_OTHER): Payer: BC Managed Care – PPO

## 2020-03-30 DIAGNOSIS — Z1159 Encounter for screening for other viral diseases: Secondary | ICD-10-CM | POA: Diagnosis not present

## 2020-03-30 LAB — SARS CORONAVIRUS 2 (TAT 6-24 HRS): SARS Coronavirus 2: NEGATIVE

## 2020-03-31 ENCOUNTER — Other Ambulatory Visit: Payer: Self-pay

## 2020-04-01 ENCOUNTER — Ambulatory Visit (AMBULATORY_SURGERY_CENTER): Payer: BC Managed Care – PPO | Admitting: Gastroenterology

## 2020-04-01 ENCOUNTER — Encounter: Payer: Self-pay | Admitting: Gastroenterology

## 2020-04-01 ENCOUNTER — Other Ambulatory Visit: Payer: Self-pay

## 2020-04-01 VITALS — BP 123/78 | HR 66 | Temp 97.1°F | Resp 13 | Ht 70.0 in | Wt 195.0 lb

## 2020-04-01 DIAGNOSIS — Z1211 Encounter for screening for malignant neoplasm of colon: Secondary | ICD-10-CM

## 2020-04-01 DIAGNOSIS — K635 Polyp of colon: Secondary | ICD-10-CM

## 2020-04-01 DIAGNOSIS — D124 Benign neoplasm of descending colon: Secondary | ICD-10-CM

## 2020-04-01 MED ORDER — SODIUM CHLORIDE 0.9 % IV SOLN: 500.0000 mL | Freq: Once | INTRAVENOUS | Status: DC

## 2020-04-01 NOTE — Progress Notes (Signed)
Pt's states no medical or surgical changes since previsit or office visit.  CW - vitals 

## 2020-04-01 NOTE — Progress Notes (Signed)
Report to PACU, RN, vss, BBS= Clear.  

## 2020-04-01 NOTE — Progress Notes (Signed)
Called to room to assist during endoscopic procedure.  Patient ID and intended procedure confirmed with present staff. Received instructions for my participation in the procedure from the performing physician.  

## 2020-04-01 NOTE — Op Note (Signed)
[facility] Patient Name: Austin Gonzalez Procedure Date: 04/01/2020 10:38 AM MRN: [id number] Endoscopist: Ladene Artist , MD Age: 49 Referring MD:  Date of Birth: [birth year] Gender: Male Account #: [id number] Procedure:                Colonoscopy Indications:              Screening for colorectal malignant neoplasm Medicines:                Monitored Anesthesia Care Procedure:                Pre-Anesthesia Assessment:                           - Prior to the procedure, a History and Physical                            was performed, and patient medications and                            allergies were reviewed. The patient's tolerance of                            previous anesthesia was also reviewed. The risks                            and benefits of the procedure and the sedation                            options and risks were discussed with the patient.                            All questions were answered, and informed consent                            was obtained. Prior Anticoagulants: The patient has                            taken no previous anticoagulant or antiplatelet                            agents. ASA Grade Assessment: II - A patient with                            mild systemic disease. After reviewing the risks                            and benefits, the patient was deemed in                            satisfactory condition to undergo the procedure.                           After obtaining informed consent, the colonoscope  was passed under direct vision. Throughout the                            procedure, the patient's blood pressure, pulse, and                            oxygen saturations were monitored continuously. The                            Colonoscope was introduced through the anus and                            advanced to the the cecum, identified by                            appendiceal orifice and  ileocecal valve. The                            ileocecal valve, appendiceal orifice, and rectum                            were photographed. The quality of the bowel                            preparation was good. The colonoscopy was performed                            without difficulty. The patient tolerated the                            procedure well. Scope In: 10:42:37 AM Scope Out: 10:57:20 AM Scope Withdrawal Time: 0 hours 11 minutes 50 seconds  Total Procedure Duration: 0 hours 14 minutes 43 seconds  Findings:                 The perianal and digital rectal examinations were                            normal.                           A 5 mm polyp was found in the descending colon. The                            polyp was sessile. The polyp was removed with a                            cold snare. Resection and retrieval were complete.                           A few small-mouthed diverticula were found in the                            left colon. There was no evidence of diverticular  bleeding.                           Internal hemorrhoids were found during                            retroflexion. The hemorrhoids were small and Grade                            I (internal hemorrhoids that do not prolapse).                           The exam was otherwise without abnormality on                            direct and retroflexion views. Complications:            No immediate complications. Estimated blood loss:                            None. Estimated Blood Loss:     Estimated blood loss: none. Impression:               - One 5 mm polyp in the descending colon, removed                            with a cold snare. Resected and retrieved.                           - Mild diverticulosis in the left colon.                           - Internal hemorrhoids.                           - The examination was otherwise normal on direct                             and retroflexion views. Recommendation:           - Repeat colonoscopy after studies are complete for                            surveillance based on pathology results.                           - Patient has a contact number available for                            emergencies. The signs and symptoms of potential                            delayed complications were discussed with the                            patient. Return to normal activities tomorrow.  Written discharge instructions were provided to the                            patient.                           - Resume previous diet.                           - Continue present medications.                           - Await pathology results. Ladene Artist, MD 04/01/2020 10:59:47 AM This report has been signed electronically.

## 2020-04-01 NOTE — Patient Instructions (Signed)
Handouts given:  Hemorrhoids, Diverticulosis, Polyps Resume previous diet Continue present medications Await pathology results  YOU HAD AN ENDOSCOPIC PROCEDURE TODAY AT [facility]:   Refer to the procedure report that was given to you for any specific questions about what was found during the examination.  If the procedure report does not answer your questions, please call your gastroenterologist to clarify.  If you requested that your care partner not be given the details of your procedure findings, then the procedure report has been included in a sealed envelope for you to review at your convenience later.  YOU SHOULD EXPECT: Some feelings of bloating in the abdomen. Passage of more gas than usual.  Walking can help get rid of the air that was put into your GI tract during the procedure and reduce the bloating. If you had a lower endoscopy (such as a colonoscopy or flexible sigmoidoscopy) you may notice spotting of blood in your stool or on the toilet paper. If you underwent a bowel prep for your procedure, you may not have a normal bowel movement for a few days.  Please Note:  You might notice some irritation and congestion in your nose or some drainage.  This is from the oxygen used during your procedure.  There is no need for concern and it should clear up in a day or so.  SYMPTOMS TO REPORT IMMEDIATELY:   Following lower endoscopy (colonoscopy or flexible sigmoidoscopy):  Excessive amounts of blood in the stool  Significant tenderness or worsening of abdominal pains  Swelling of the abdomen that is new, acute  Fever of 100F or higher   For urgent or emergent issues, a gastroenterologist can be reached at any hour by calling [phone]. Do not use MyChart messaging for urgent concerns.    DIET:  We do recommend a small meal at first, but then you may proceed to your regular diet.  Drink plenty of fluids but you should avoid alcoholic beverages for 24  hours.  ACTIVITY:  You should plan to take it easy for the rest of today and you should NOT DRIVE or use heavy machinery until tomorrow (because of the sedation medicines used during the test).    FOLLOW UP: Our staff will call the number listed on your records 48-72 hours following your procedure to check on you and address any questions or concerns that you may have regarding the information given to you following your procedure. If we do not reach you, we will leave a message.  We will attempt to reach you two times.  During this call, we will ask if you have developed any symptoms of COVID 19. If you develop any symptoms (ie: fever, flu-like symptoms, shortness of breath, cough etc.) before then, please call [phone].  If you test positive for Covid 19 in the 2 weeks post procedure, please call and report this information to [place].    If any biopsies were taken you will be contacted by phone or by letter within the next 1-3 weeks.  Please call us at [phone] if you have not heard about the biopsies in 3 weeks.    SIGNATURES/CONFIDENTIALITY: You and/or your care partner have signed paperwork which will be entered into your electronic medical record.  These signatures attest to the fact that that the information above on your After Visit Summary has been reviewed and is understood.  Full responsibility of the confidentiality of this discharge information lies with you and/or your care-partner.

## 2020-04-05 ENCOUNTER — Telehealth: Payer: Self-pay

## 2020-04-05 NOTE — Telephone Encounter (Signed)
Full mailbox

## 2020-04-05 NOTE — Telephone Encounter (Signed)
  Follow up Call-  Call back number 04/01/2020  Post procedure Call Back phone  # [phone]  Permission to leave phone message Yes  Some recent data might be hidden     Patient questions:  Do you have a fever, pain , or abdominal swelling? No. Pain Score  0 *  Have you tolerated food without any problems? Yes.    Have you been able to return to your normal activities? Yes.    Do you have any questions about your discharge instructions: Diet   No. Medications  No. Follow up visit  No.  Do you have questions or concerns about your Care? No.  Actions: * If pain score is 4 or above: No action needed, pain <4.  Have you developed a fever since your procedure? No 2.   Have you had an respiratory symptoms (SOB or cough) since your procedure? No  3.   Have you tested positive for COVID 19 since your procedure No  4.   Have you had any family members/close contacts diagnosed with the COVID 19 since your procedure?  No   If yes to any of these questions please route to Joylene John, RN and Joella Prince, RN

## 2020-04-14 ENCOUNTER — Encounter: Payer: Self-pay | Admitting: Gastroenterology

## 2020-04-20 DIAGNOSIS — J40 Bronchitis, not specified as acute or chronic: Secondary | ICD-10-CM | POA: Diagnosis not present

## 2020-04-20 DIAGNOSIS — R05 Cough: Secondary | ICD-10-CM | POA: Diagnosis not present

## 2020-05-25 DIAGNOSIS — E7800 Pure hypercholesterolemia, unspecified: Secondary | ICD-10-CM | POA: Diagnosis not present

## 2020-05-25 DIAGNOSIS — E1139 Type 2 diabetes mellitus with other diabetic ophthalmic complication: Secondary | ICD-10-CM | POA: Diagnosis not present

## 2020-05-25 DIAGNOSIS — Z23 Encounter for immunization: Secondary | ICD-10-CM | POA: Diagnosis not present

## 2020-07-11 ENCOUNTER — Ambulatory Visit: Payer: Self-pay | Admitting: General Surgery

## 2020-07-11 DIAGNOSIS — D171 Benign lipomatous neoplasm of skin and subcutaneous tissue of trunk: Secondary | ICD-10-CM | POA: Diagnosis not present

## 2020-07-11 NOTE — H&P (Signed)
The patient is a 49 year old male who presents with a complaint of Mass. 49 year old male who presents to the office for evaluation of a back mass. He states that this is been present for many years. He feels like it is getting larger slowly. He denies any pain.   Allergies Mammie Lorenzo, LPN; 18/84/1660 6:30 PM) No Known Drug Allergies [07/11/2020]: Allergies Reconciled  Medication History Mammie Lorenzo, LPN; 16/08/930 3:55 PM) Atorvastatin Calcium (20MG  Tablet, Oral) Active. Farxiga (10MG  Tablet, Oral) Active. metFORMIN HCl (850MG  Tablet, Oral) Active. Omeprazole (20MG  Capsule DR, Oral) Active. Onglyza (5MG  Tablet, Oral) Active. Co Q 10 (10MG  Capsule, Oral) Active. Multivitamin (Oral) Active. Aspirin (81MG  Tablet, Oral) Active. Medications Reconciled  Social History Mammie Lorenzo, LPN; 73/22/0254 2:70 PM) No drug use  Other Problems Mammie Lorenzo, LPN; 62/37/6283 1:51 PM) Chest pain Diabetes Mellitus Gastroesophageal Reflux Disease     Review of Systems Mammie Lorenzo LPN; 76/16/0737 1:06 PM) General Not Present- Appetite Loss, Chills, Fatigue, Fever, Night Sweats, Weight Gain and Weight Loss. Respiratory Present- Snoring. Not Present- Bloody sputum, Chronic Cough, Difficulty Breathing and Wheezing. Musculoskeletal Not Present- Back Pain, Joint Pain, Joint Stiffness, Muscle Pain, Muscle Weakness and Swelling of Extremities. Endocrine Not Present- Cold Intolerance, Excessive Hunger, Hair Changes, Heat Intolerance, Hot flashes and New Diabetes.  Vitals Claiborne Billings Dockery LPN; 26/94/8546 2:70 PM) 07/11/2020 2:48 PM Weight: 197.6 lb Height: 70in Body Surface Area: 2.08 m Body Mass Index: 28.35 kg/m  Temp.: 98.92F(Infrared)  Pulse: 109 (Regular)  BP: 142/68(Sitting, Left Arm, Standard)        Physical Exam Leighton Ruff MD; 35/00/9381 3:00 PM)  General Mental Status-Alert. General Appearance-Cooperative.  Integumentary Problem  #1 Location - Back - right upper(Mobile SQ mass approximately 5 cm in diameter).    Assessment & Plan Leighton Ruff MD; 82/99/3716 2:58 PM)  LIPOMA OF BACK (D17.1) Impression: 49 year old male who presents to the office with a lipoma of the mid back just to the right of midline. He states that it is getting somewhat larger and would like to have it removed. We discussed excisional biopsy as an outpatient procedure. We discussed the risk and benefits of this in detail including bleeding, seroma and postoperative pain. All questions were answered. He would like to proceed with surgery if possible. He does carpentry work for living and I recommended that he restrict his arm motion for approximately 2 weeks after surgery.

## 2020-11-16 DIAGNOSIS — E7800 Pure hypercholesterolemia, unspecified: Secondary | ICD-10-CM | POA: Diagnosis not present

## 2020-11-16 DIAGNOSIS — Z0000 Encounter for general adult medical examination without abnormal findings: Secondary | ICD-10-CM | POA: Diagnosis not present

## 2020-11-16 DIAGNOSIS — Z125 Encounter for screening for malignant neoplasm of prostate: Secondary | ICD-10-CM | POA: Diagnosis not present

## 2020-11-16 DIAGNOSIS — E291 Testicular hypofunction: Secondary | ICD-10-CM | POA: Diagnosis not present

## 2020-11-16 DIAGNOSIS — E1165 Type 2 diabetes mellitus with hyperglycemia: Secondary | ICD-10-CM | POA: Diagnosis not present

## 2020-11-22 DIAGNOSIS — R82998 Other abnormal findings in urine: Secondary | ICD-10-CM | POA: Diagnosis not present

## 2020-11-22 DIAGNOSIS — Z1212 Encounter for screening for malignant neoplasm of rectum: Secondary | ICD-10-CM | POA: Diagnosis not present

## 2020-11-22 DIAGNOSIS — Z1339 Encounter for screening examination for other mental health and behavioral disorders: Secondary | ICD-10-CM | POA: Diagnosis not present

## 2020-11-22 DIAGNOSIS — Z0000 Encounter for general adult medical examination without abnormal findings: Secondary | ICD-10-CM | POA: Diagnosis not present

## 2020-11-22 DIAGNOSIS — E1139 Type 2 diabetes mellitus with other diabetic ophthalmic complication: Secondary | ICD-10-CM | POA: Diagnosis not present

## 2020-11-22 DIAGNOSIS — E1165 Type 2 diabetes mellitus with hyperglycemia: Secondary | ICD-10-CM | POA: Diagnosis not present

## 2020-11-22 DIAGNOSIS — Z1331 Encounter for screening for depression: Secondary | ICD-10-CM | POA: Diagnosis not present

## 2020-12-12 DIAGNOSIS — E1139 Type 2 diabetes mellitus with other diabetic ophthalmic complication: Secondary | ICD-10-CM | POA: Diagnosis not present

## 2021-01-13 DIAGNOSIS — J3489 Other specified disorders of nose and nasal sinuses: Secondary | ICD-10-CM | POA: Diagnosis not present

## 2021-01-13 DIAGNOSIS — R059 Cough, unspecified: Secondary | ICD-10-CM | POA: Diagnosis not present

## 2021-01-13 DIAGNOSIS — H9209 Otalgia, unspecified ear: Secondary | ICD-10-CM | POA: Diagnosis not present

## 2021-01-13 DIAGNOSIS — J029 Acute pharyngitis, unspecified: Secondary | ICD-10-CM | POA: Diagnosis not present

## 2021-01-18 DIAGNOSIS — H524 Presbyopia: Secondary | ICD-10-CM | POA: Diagnosis not present

## 2021-01-18 DIAGNOSIS — E119 Type 2 diabetes mellitus without complications: Secondary | ICD-10-CM | POA: Diagnosis not present

## 2021-03-08 DIAGNOSIS — E1139 Type 2 diabetes mellitus with other diabetic ophthalmic complication: Secondary | ICD-10-CM | POA: Diagnosis not present

## 2021-03-29 DIAGNOSIS — E1139 Type 2 diabetes mellitus with other diabetic ophthalmic complication: Secondary | ICD-10-CM | POA: Diagnosis not present

## 2021-03-29 DIAGNOSIS — E1165 Type 2 diabetes mellitus with hyperglycemia: Secondary | ICD-10-CM | POA: Diagnosis not present

## 2021-06-09 DIAGNOSIS — Z23 Encounter for immunization: Secondary | ICD-10-CM | POA: Diagnosis not present

## 2021-06-09 DIAGNOSIS — E1165 Type 2 diabetes mellitus with hyperglycemia: Secondary | ICD-10-CM | POA: Diagnosis not present

## 2021-06-09 DIAGNOSIS — E1139 Type 2 diabetes mellitus with other diabetic ophthalmic complication: Secondary | ICD-10-CM | POA: Diagnosis not present

## 2021-08-29 DIAGNOSIS — E1165 Type 2 diabetes mellitus with hyperglycemia: Secondary | ICD-10-CM | POA: Diagnosis not present

## 2021-08-29 DIAGNOSIS — Z1339 Encounter for screening examination for other mental health and behavioral disorders: Secondary | ICD-10-CM | POA: Diagnosis not present

## 2021-08-29 DIAGNOSIS — Z1331 Encounter for screening for depression: Secondary | ICD-10-CM | POA: Diagnosis not present

## 2021-08-29 DIAGNOSIS — E1139 Type 2 diabetes mellitus with other diabetic ophthalmic complication: Secondary | ICD-10-CM | POA: Diagnosis not present

## 2021-11-24 DIAGNOSIS — E1165 Type 2 diabetes mellitus with hyperglycemia: Secondary | ICD-10-CM | POA: Diagnosis not present

## 2021-11-24 DIAGNOSIS — Z125 Encounter for screening for malignant neoplasm of prostate: Secondary | ICD-10-CM | POA: Diagnosis not present

## 2021-12-01 DIAGNOSIS — Z1331 Encounter for screening for depression: Secondary | ICD-10-CM | POA: Diagnosis not present

## 2021-12-01 DIAGNOSIS — Z1212 Encounter for screening for malignant neoplasm of rectum: Secondary | ICD-10-CM | POA: Diagnosis not present

## 2021-12-01 DIAGNOSIS — Z0000 Encounter for general adult medical examination without abnormal findings: Secondary | ICD-10-CM | POA: Diagnosis not present

## 2021-12-01 DIAGNOSIS — E1139 Type 2 diabetes mellitus with other diabetic ophthalmic complication: Secondary | ICD-10-CM | POA: Diagnosis not present

## 2021-12-01 DIAGNOSIS — R82998 Other abnormal findings in urine: Secondary | ICD-10-CM | POA: Diagnosis not present

## 2021-12-01 DIAGNOSIS — Z1339 Encounter for screening examination for other mental health and behavioral disorders: Secondary | ICD-10-CM | POA: Diagnosis not present

## 2021-12-21 DIAGNOSIS — R0781 Pleurodynia: Secondary | ICD-10-CM | POA: Diagnosis not present

## 2021-12-21 DIAGNOSIS — Z6827 Body mass index (BMI) 27.0-27.9, adult: Secondary | ICD-10-CM | POA: Diagnosis not present

## 2022-01-24 DIAGNOSIS — H524 Presbyopia: Secondary | ICD-10-CM | POA: Diagnosis not present

## 2022-01-24 DIAGNOSIS — E119 Type 2 diabetes mellitus without complications: Secondary | ICD-10-CM | POA: Diagnosis not present

## 2022-03-27 DIAGNOSIS — H6502 Acute serous otitis media, left ear: Secondary | ICD-10-CM | POA: Diagnosis not present

## 2022-03-27 DIAGNOSIS — H60393 Other infective otitis externa, bilateral: Secondary | ICD-10-CM | POA: Diagnosis not present

## 2022-03-27 DIAGNOSIS — J329 Chronic sinusitis, unspecified: Secondary | ICD-10-CM | POA: Diagnosis not present

## 2022-03-27 DIAGNOSIS — K120 Recurrent oral aphthae: Secondary | ICD-10-CM | POA: Diagnosis not present

## 2022-04-04 DIAGNOSIS — R0981 Nasal congestion: Secondary | ICD-10-CM | POA: Diagnosis not present

## 2022-04-04 DIAGNOSIS — H65194 Other acute nonsuppurative otitis media, recurrent, right ear: Secondary | ICD-10-CM | POA: Diagnosis not present

## 2022-04-04 DIAGNOSIS — J0111 Acute recurrent frontal sinusitis: Secondary | ICD-10-CM | POA: Diagnosis not present

## 2022-05-11 DIAGNOSIS — Z23 Encounter for immunization: Secondary | ICD-10-CM | POA: Diagnosis not present

## 2022-05-11 DIAGNOSIS — E1139 Type 2 diabetes mellitus with other diabetic ophthalmic complication: Secondary | ICD-10-CM | POA: Diagnosis not present

## 2022-05-11 DIAGNOSIS — E1165 Type 2 diabetes mellitus with hyperglycemia: Secondary | ICD-10-CM | POA: Diagnosis not present

## 2022-06-27 ENCOUNTER — Telehealth (INDEPENDENT_AMBULATORY_CARE_PROVIDER_SITE_OTHER): Payer: Self-pay

## 2022-06-27 NOTE — Telephone Encounter (Signed)
Called patient to schedule AHA, left a message to call back. If the patient calls back please schedule their AHA and inform the patient to come in 30 minutes early to complete the Health Risk Assessment (HRA). [employer] Thank you.

## 2022-07-09 ENCOUNTER — Telehealth (INDEPENDENT_AMBULATORY_CARE_PROVIDER_SITE_OTHER): Payer: Self-pay

## 2022-07-09 NOTE — Telephone Encounter (Signed)
Called patient to schedule AHA, left a message to call back. If the patient calls back please schedule their AHA and inform the patient to come in 30 minutes early to complete the Health Risk Assessment (HRA). [employer] Thank you.

## 2022-07-31 ENCOUNTER — Telehealth (INDEPENDENT_AMBULATORY_CARE_PROVIDER_SITE_OTHER): Payer: Self-pay

## 2022-07-31 NOTE — Telephone Encounter (Signed)
Called both numbers on file to schedule aha appt but both numbers are disconnected. If pt calls pls assist with scheduling.

## 2022-08-10 ENCOUNTER — Telehealth (INDEPENDENT_AMBULATORY_CARE_PROVIDER_SITE_OTHER): Payer: Self-pay

## 2022-08-10 NOTE — Telephone Encounter (Signed)
Called patient to schedule AHA, left a message to call back. If the patient calls back please schedule their AHA and inform the patient to come in 30 minutes early to complete the Health Risk Assessment (HRA). [employer] Thank you.

## 2022-09-03 ENCOUNTER — Telehealth (INDEPENDENT_AMBULATORY_CARE_PROVIDER_SITE_OTHER): Payer: Self-pay

## 2022-09-03 NOTE — Telephone Encounter (Signed)
Called pt to schedule aha, number on file is not in service. Please update number and schedule aha if pt calls.

## 2022-12-07 DIAGNOSIS — E7800 Pure hypercholesterolemia, unspecified: Secondary | ICD-10-CM | POA: Diagnosis not present

## 2022-12-07 DIAGNOSIS — K219 Gastro-esophageal reflux disease without esophagitis: Secondary | ICD-10-CM | POA: Diagnosis not present

## 2022-12-07 DIAGNOSIS — Z125 Encounter for screening for malignant neoplasm of prostate: Secondary | ICD-10-CM | POA: Diagnosis not present

## 2022-12-07 DIAGNOSIS — E1139 Type 2 diabetes mellitus with other diabetic ophthalmic complication: Secondary | ICD-10-CM | POA: Diagnosis not present

## 2022-12-07 DIAGNOSIS — Z1212 Encounter for screening for malignant neoplasm of rectum: Secondary | ICD-10-CM | POA: Diagnosis not present

## 2022-12-07 DIAGNOSIS — E291 Testicular hypofunction: Secondary | ICD-10-CM | POA: Diagnosis not present

## 2022-12-14 DIAGNOSIS — Z1339 Encounter for screening examination for other mental health and behavioral disorders: Secondary | ICD-10-CM | POA: Diagnosis not present

## 2022-12-14 DIAGNOSIS — Z0000 Encounter for general adult medical examination without abnormal findings: Secondary | ICD-10-CM | POA: Diagnosis not present

## 2022-12-14 DIAGNOSIS — E1139 Type 2 diabetes mellitus with other diabetic ophthalmic complication: Secondary | ICD-10-CM | POA: Diagnosis not present

## 2022-12-14 DIAGNOSIS — Z1331 Encounter for screening for depression: Secondary | ICD-10-CM | POA: Diagnosis not present

## 2022-12-14 DIAGNOSIS — R82998 Other abnormal findings in urine: Secondary | ICD-10-CM | POA: Diagnosis not present

## 2022-12-21 ENCOUNTER — Telehealth (INDEPENDENT_AMBULATORY_CARE_PROVIDER_SITE_OTHER): Payer: Self-pay

## 2022-12-21 NOTE — Telephone Encounter (Signed)
I reached out to the patient to schedule AHA, left a message to call back. If patient returns my call, please schedule their AHA and inform the patient to come in 30 minutes early to complete the Health Risk Assessment (HRA) form. Thank you.[facility])    [phone] [facility] Phone) - inactive  [phone] (Work Phone) - call does not connect  [phone] (Mobile) - left a message

## 2023-01-11 ENCOUNTER — Telehealth (INDEPENDENT_AMBULATORY_CARE_PROVIDER_SITE_OTHER): Payer: Self-pay

## 2023-01-11 NOTE — Telephone Encounter (Signed)
I reached out to the patient to schedule AHA, left a message to call back. If patient returns my call, please schedule their AHA and inform the patient to come in 30 minutes early to complete the Health Risk Assessment (HRA) form. Thank you.([facility])

## 2023-01-29 DIAGNOSIS — E113293 Type 2 diabetes mellitus with mild nonproliferative diabetic retinopathy without macular edema, bilateral: Secondary | ICD-10-CM | POA: Diagnosis not present

## 2023-01-29 DIAGNOSIS — H524 Presbyopia: Secondary | ICD-10-CM | POA: Diagnosis not present

## 2023-01-29 DIAGNOSIS — H52203 Unspecified astigmatism, bilateral: Secondary | ICD-10-CM | POA: Diagnosis not present

## 2023-06-26 DIAGNOSIS — E1139 Type 2 diabetes mellitus with other diabetic ophthalmic complication: Secondary | ICD-10-CM | POA: Diagnosis not present

## 2023-06-26 DIAGNOSIS — Z23 Encounter for immunization: Secondary | ICD-10-CM | POA: Diagnosis not present

## 2023-06-26 DIAGNOSIS — E291 Testicular hypofunction: Secondary | ICD-10-CM | POA: Diagnosis not present

## 2023-12-20 DIAGNOSIS — Z125 Encounter for screening for malignant neoplasm of prostate: Secondary | ICD-10-CM | POA: Diagnosis not present

## 2023-12-20 DIAGNOSIS — E7800 Pure hypercholesterolemia, unspecified: Secondary | ICD-10-CM | POA: Diagnosis not present

## 2023-12-20 DIAGNOSIS — E1165 Type 2 diabetes mellitus with hyperglycemia: Secondary | ICD-10-CM | POA: Diagnosis not present

## 2023-12-20 DIAGNOSIS — E291 Testicular hypofunction: Secondary | ICD-10-CM | POA: Diagnosis not present

## 2023-12-20 DIAGNOSIS — Z1212 Encounter for screening for malignant neoplasm of rectum: Secondary | ICD-10-CM | POA: Diagnosis not present

## 2023-12-27 DIAGNOSIS — Z1331 Encounter for screening for depression: Secondary | ICD-10-CM | POA: Diagnosis not present

## 2023-12-27 DIAGNOSIS — Z0000 Encounter for general adult medical examination without abnormal findings: Secondary | ICD-10-CM | POA: Diagnosis not present

## 2023-12-27 DIAGNOSIS — R82998 Other abnormal findings in urine: Secondary | ICD-10-CM | POA: Diagnosis not present

## 2023-12-27 DIAGNOSIS — Z1339 Encounter for screening examination for other mental health and behavioral disorders: Secondary | ICD-10-CM | POA: Diagnosis not present

## 2023-12-27 DIAGNOSIS — E1139 Type 2 diabetes mellitus with other diabetic ophthalmic complication: Secondary | ICD-10-CM | POA: Diagnosis not present

## 2024-02-04 DIAGNOSIS — H524 Presbyopia: Secondary | ICD-10-CM | POA: Diagnosis not present

## 2024-02-04 DIAGNOSIS — E113293 Type 2 diabetes mellitus with mild nonproliferative diabetic retinopathy without macular edema, bilateral: Secondary | ICD-10-CM | POA: Diagnosis not present

## 2024-02-04 DIAGNOSIS — H2513 Age-related nuclear cataract, bilateral: Secondary | ICD-10-CM | POA: Diagnosis not present
